# Patient Record
Sex: Female | Born: 1990 | Race: White | Hispanic: No | Marital: Single | Smoking: Never smoker
Health system: Southern US, Community
[De-identification: ages and names within clinical notes are randomized; demographics above are authoritative.]

## PROBLEM LIST (undated history)

## (undated) DIAGNOSIS — N809 Endometriosis, unspecified: Secondary | ICD-10-CM

## (undated) DIAGNOSIS — M25569 Pain in unspecified knee: Secondary | ICD-10-CM

## (undated) DIAGNOSIS — F99 Mental disorder, not otherwise specified: Secondary | ICD-10-CM

## (undated) DIAGNOSIS — F419 Anxiety disorder, unspecified: Secondary | ICD-10-CM

## (undated) HISTORY — PX: KNEE SURGERY: SHX244

## (undated) HISTORY — PX: KNEE ARTHROSCOPY: SUR90

## (undated) HISTORY — DX: Anxiety disorder, unspecified: F41.9

## (undated) HISTORY — DX: Pain in unspecified knee: M25.569

---

## 2011-06-14 ENCOUNTER — Ambulatory Visit (INDEPENDENT_AMBULATORY_CARE_PROVIDER_SITE_OTHER): Payer: No Typology Code available for payment source | Admitting: Physician Assistant

## 2011-06-14 DIAGNOSIS — F411 Generalized anxiety disorder: Secondary | ICD-10-CM

## 2011-06-14 DIAGNOSIS — F410 Panic disorder [episodic paroxysmal anxiety] without agoraphobia: Secondary | ICD-10-CM

## 2011-06-14 DIAGNOSIS — Z3009 Encounter for other general counseling and advice on contraception: Secondary | ICD-10-CM

## 2011-06-14 MED ORDER — SERTRALINE HCL 100 MG PO TABS: 100.0000 mg | ORAL_TABLET | Freq: Every day | ORAL | Status: DC

## 2011-06-14 MED ORDER — ALPRAZOLAM 0.25 MG PO TABS: 0.2500 mg | ORAL_TABLET | Freq: Three times a day (TID) | ORAL | Status: AC | PRN

## 2011-06-14 MED ORDER — NORETHINDRONE ACET-ETHINYL EST 1.5-30 MG-MCG PO TABS: 1.0000 | ORAL_TABLET | Freq: Every day | ORAL | Status: DC

## 2011-06-14 NOTE — Patient Instructions (Signed)
Continue condom use. Use birth control pills until you have the IUD placed.  If you haven't heard about your appointment with GYN in 2 weeks, please call.  When restarting the sertraline (Zoloft), take 1/2 tablet daily for 2 weeks, then increase to the full tablet.

## 2011-06-14 NOTE — Progress Notes (Signed)
  Subjective:    Patient ID: Sarah Chen, female    DOB: 29-Oct-1990, 21 y.o.   MRN: [id number]  HPI This patient presents to establish for primary care. She needs a refill of sertraline for generalized anxiety disorder and would like to discuss contraception options.  She is [place] and grew up in [place] where her father was stationed with the [employer]. He has retired there. She is in [place] for school, attending [employer]. Her parents divorced when she was young. She lived primarily with her father. Her mother lives in [place].  She was previously on Depo-Provera for contraception. Did not like the mood lability that she experienced near the end of each course. She then tried combined pills, had a difficult time remembering to take them regularly. She is interested in an IUD.  Diagnosed with generalized anxiety disorder in high school. Was having frequent panic attacks in class. Was started on sertraline just before leaving [place] to come to the [place]. Did not notice an improvement in her symptoms on sertraline 50 mg daily, has since run out. Her symptoms have progressed, panic attacks happening frequently. She describes a sensation of pain in her chest and feelings of severe fear and anxiety. Episodes last approximately 10 minutes and when they are resolved she feels exhausted.   Review of Systems  Constitutional: Negative.   HENT: Negative.   Eyes: Negative.   Respiratory: Positive for chest tightness. Negative for cough, choking, shortness of breath, wheezing and stridor.   Cardiovascular: Positive for chest pain and palpitations. Negative for leg swelling.  Gastrointestinal: Negative for nausea, vomiting and diarrhea.  Genitourinary: Negative for dysuria, urgency and frequency.  Musculoskeletal: Negative for myalgias, back pain and arthralgias.  Neurological: Negative for dizziness, syncope, weakness, light-headedness and headaches.  Hematological: Negative for adenopathy.    Psychiatric/Behavioral: Negative for suicidal ideas, confusion, self-injury, dysphoric mood and decreased concentration. The patient is nervous/anxious.        Objective:   Physical Exam  Vitals reviewed. Constitutional: She is oriented to person, place, and time. She appears well-developed and well-nourished. No distress.  HENT:  Head: Normocephalic and atraumatic.  Eyes: Conjunctivae are normal.  Neck: Normal range of motion. Neck supple. No thyromegaly present.  Cardiovascular: Regular rhythm, normal heart sounds and normal pulses.   No extrasystoles are present. Tachycardia present.   Pulmonary/Chest: Effort normal and breath sounds normal.  Lymphadenopathy:    She has no cervical adenopathy.  Neurological: She is alert and oriented to person, place, and time. No cranial nerve deficit.  Skin: Skin is warm and dry. No rash noted.  Psychiatric: Her speech is normal and behavior is normal. Judgment and thought content normal. Her mood appears anxious. Her affect is not angry, not blunt, not labile and not inappropriate. She does not exhibit a depressed mood.      Assessment & Plan:  1. Establish for primary care.  2. Contraception. Loestrin prescribed for short-term. Refer to GYN for IUD placement. Continue condom use.  3. Generalized anxiety disorder. Restart sertraline. 100 mg, one half tablet daily for 2 weeks then one tablet daily. Alprazolam when necessary panic. Reassess in 6 weeks.

## 2011-07-30 ENCOUNTER — Ambulatory Visit: Payer: No Typology Code available for payment source | Admitting: Physician Assistant

## 2011-07-31 ENCOUNTER — Encounter (HOSPITAL_COMMUNITY): Payer: Self-pay | Admitting: *Deleted

## 2011-07-31 ENCOUNTER — Emergency Department (HOSPITAL_COMMUNITY)
Admission: EM | Admit: 2011-07-31 | Discharge: 2011-07-31 | Disposition: A | Discharge status: Home / Self Care | Payer: No Typology Code available for payment source | Attending: Emergency Medicine | Admitting: Emergency Medicine

## 2011-07-31 ENCOUNTER — Emergency Department (HOSPITAL_COMMUNITY): Payer: No Typology Code available for payment source

## 2011-07-31 DIAGNOSIS — M542 Cervicalgia: Secondary | ICD-10-CM | POA: Insufficient documentation

## 2011-07-31 DIAGNOSIS — S139XXA Sprain of joints and ligaments of unspecified parts of neck, initial encounter: Secondary | ICD-10-CM | POA: Insufficient documentation

## 2011-07-31 DIAGNOSIS — Y92410 Unspecified street and highway as the place of occurrence of the external cause: Secondary | ICD-10-CM | POA: Insufficient documentation

## 2011-07-31 DIAGNOSIS — S161XXA Strain of muscle, fascia and tendon at neck level, initial encounter: Secondary | ICD-10-CM

## 2011-07-31 MED ORDER — OXYCODONE-ACETAMINOPHEN 5-325 MG PO TABS
2.0000 | ORAL_TABLET | Freq: Once | ORAL | Status: AC
  Administered 2011-07-31: 2 via ORAL
  Filled 2011-07-31: qty 2

## 2011-07-31 MED ORDER — CYCLOBENZAPRINE HCL 10 MG PO TABS: 10.0000 mg | ORAL_TABLET | Freq: Two times a day (BID) | ORAL | Status: AC | PRN

## 2011-07-31 MED ORDER — OXYCODONE-ACETAMINOPHEN 5-325 MG PO TABS: 2.0000 | ORAL_TABLET | ORAL | Status: AC | PRN
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
## 2011-07-31 NOTE — Discharge Instructions (Signed)
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
## 2011-07-31 NOTE — ED Notes (Signed)
Patient is AOx4 and comfortable with her discharge instructions.  Significant other is driving her home.

## 2011-08-29 ENCOUNTER — Telehealth: Payer: Self-pay | Admitting: Internal Medicine

## 2011-08-29 MED ORDER — SERTRALINE HCL 100 MG PO TABS: 100.0000 mg | ORAL_TABLET | Freq: Every day | ORAL | Status: DC

## 2011-08-29 NOTE — Telephone Encounter (Signed)
Refilled sertraline 100 mg #90 no refills

## 2011-08-30 ENCOUNTER — Telehealth: Payer: Self-pay | Admitting: Physician Assistant

## 2011-08-30 ENCOUNTER — Other Ambulatory Visit: Payer: Self-pay | Admitting: Physician Assistant

## 2011-08-30 MED ORDER — NORETHINDRONE ACET-ETHINYL EST 1.5-30 MG-MCG PO TABS: 1.0000 | ORAL_TABLET | Freq: Every day | ORAL | Status: DC

## 2011-08-30 NOTE — Telephone Encounter (Signed)
Fax request for refill of Junel 1.5/30.  Authorized #3 packs, refill x 3

## 2011-10-10 ENCOUNTER — Emergency Department (HOSPITAL_COMMUNITY): Payer: No Typology Code available for payment source

## 2011-10-10 ENCOUNTER — Encounter (HOSPITAL_COMMUNITY): Payer: Self-pay | Admitting: Emergency Medicine

## 2011-10-10 ENCOUNTER — Emergency Department (HOSPITAL_COMMUNITY)
Admission: EM | Admit: 2011-10-10 | Discharge: 2011-10-10 | Disposition: A | Discharge status: Home / Self Care | Payer: No Typology Code available for payment source | Attending: Emergency Medicine | Admitting: Emergency Medicine

## 2011-10-10 DIAGNOSIS — S93409A Sprain of unspecified ligament of unspecified ankle, initial encounter: Secondary | ICD-10-CM

## 2011-10-10 DIAGNOSIS — S9000XA Contusion of unspecified ankle, initial encounter: Secondary | ICD-10-CM | POA: Insufficient documentation

## 2011-10-10 DIAGNOSIS — F411 Generalized anxiety disorder: Secondary | ICD-10-CM | POA: Insufficient documentation

## 2011-10-10 DIAGNOSIS — Y92410 Unspecified street and highway as the place of occurrence of the external cause: Secondary | ICD-10-CM | POA: Insufficient documentation

## 2011-10-10 MED ORDER — METHOCARBAMOL 500 MG PO TABS: 500.0000 mg | ORAL_TABLET | Freq: Two times a day (BID) | ORAL | Status: AC

## 2011-10-10 MED ORDER — TRAMADOL HCL 50 MG PO TABS: 50.0000 mg | ORAL_TABLET | Freq: Four times a day (QID) | ORAL | Status: AC | PRN
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
## 2011-10-10 NOTE — Discharge Instructions (Signed)
Ankle Sprain An ankle sprain is an injury to the strong, fibrous tissues (ligaments) that hold the bones of your ankle joint together.  CAUSES Ankle sprain usually is caused by a fall or by twisting your ankle. People who participate in sports are more prone to these types of injuries.  SYMPTOMS  Symptoms of ankle sprain include:  Pain in your ankle. The pain may be present at rest or only when you are trying to stand or walk.   Swelling.   Bruising. Bruising may develop immediately or within 1 to 2 days after your injury.   Difficulty standing or walking.  DIAGNOSIS  Your caregiver will ask you details about your injury and perform a physical exam of your ankle to determine if you have an ankle sprain. During the physical exam, your caregiver will press and squeeze specific areas of your foot and ankle. Your caregiver will try to move your ankle in certain ways. An X-ray exam may be done to be sure a bone was not broken or a ligament did not separate from one of the bones in your ankle (avulsion).  TREATMENT  Certain types of braces can help stabilize your ankle. Your caregiver can make a recommendation for this. Your caregiver may recommend the use of medication for pain. If your sprain is severe, your caregiver may refer you to a surgeon who helps to restore function to parts of your skeletal system (orthopedist) or a physical therapist. HOME CARE INSTRUCTIONS  Apply ice to your injury for 1 to 2 days or as directed by your caregiver. Applying ice helps to reduce inflammation and pain.  Put ice in a plastic bag.   Place a towel between your skin and the bag.   Leave the ice on for 15 to 20 minutes at a time, every 2 hours while you are awake.   Take over-the-counter or prescription medicines for pain, discomfort, or fever only as directed by your caregiver.   Keep your injured leg elevated, when possible, to lessen swelling.   If your caregiver recommends crutches, use them as  instructed. Gradually, put weight on the affected ankle. Continue to use crutches or a cane until you can walk without feeling pain in your ankle.   If you have a plaster splint, wear the splint as directed by your caregiver. Do not rest it on anything harder than a pillow the first 24 hours. Do not put weight on it. Do not get it wet. You may take it off to take a shower or bath.   You may have been given an elastic bandage to wear around your ankle to provide support. If the elastic bandage is too tight (you have numbness or tingling in your foot or your foot becomes cold and blue), adjust the bandage to make it comfortable.   If you have an air splint, you may blow more air into it or let air out to make it more comfortable. You may take your splint off at night and before taking a shower or bath.   Wiggle your toes in the splint several times per day if you are able.  SEEK MEDICAL CARE IF:   You have an increase in bruising, swelling, or pain.   Your toes feel cold.   Pain relief is not achieved with medication.  SEEK IMMEDIATE MEDICAL CARE IF: Your toes are numb or blue or you have severe pain. MAKE SURE YOU:   Understand these instructions.   Will watch your condition.     Will get help right away if you are not doing well or get worse.  Document Released: 04/22/2005 Document Revised: 04/11/2011 Document Reviewed: 11/25/2007 [facility] Patient Information 2012 [place], [place].  Motor Vehicle Collision  It is common to have multiple bruises and sore muscles after a motor vehicle collision (MVC). These tend to feel worse for the first 24 hours. You may have the most stiffness and soreness over the first several hours. You may also feel worse when you wake up the first morning after your collision. After this point, you will usually begin to improve with each day. The speed of improvement often depends on the severity of the collision, the number of injuries, and the location and nature  of these injuries. HOME CARE INSTRUCTIONS   Put ice on the injured area.   Put ice in a plastic bag.   Place a towel between your skin and the bag.   Leave the ice on for 15 to 20 minutes, 3 to 4 times a day.   Drink enough fluids to keep your urine clear or pale yellow. Do not drink alcohol.   Take a warm shower or bath once or twice a day. This will increase blood flow to sore muscles.   You may return to activities as directed by your caregiver. Be careful when lifting, as this may aggravate neck or back pain.   Only take over-the-counter or prescription medicines for pain, discomfort, or fever as directed by your caregiver. Do not use aspirin. This may increase bruising and bleeding.  SEEK IMMEDIATE MEDICAL CARE IF:  You have numbness, tingling, or weakness in the arms or legs.   You develop severe headaches not relieved with medicine.   You have severe neck pain, especially tenderness in the middle of the back of your neck.   You have changes in bowel or bladder control.   There is increasing pain in any area of the body.   You have shortness of breath, lightheadedness, dizziness, or fainting.   You have chest pain.   You feel sick to your stomach (nauseous), throw up (vomit), or sweat.   You have increasing abdominal discomfort.   There is blood in your urine, stool, or vomit.   You have pain in your shoulder (shoulder strap areas).   You feel your symptoms are getting worse.  MAKE SURE YOU:   Understand these instructions.   Will watch your condition.   Will get help right away if you are not doing well or get worse.  Document Released: 04/22/2005 Document Revised: 04/11/2011 Document Reviewed: 09/19/2010 [facility] Patient Information 2012 [place], [place].  Contusion A contusion is a deep bruise. Contusions are the result of an injury that caused bleeding under the skin. The contusion may turn blue, purple, or yellow. Minor injuries will give you a painless  contusion, but more severe contusions may stay painful and swollen for a few weeks.  CAUSES  A contusion is usually caused by a blow, trauma, or direct force to an area of the body. SYMPTOMS   Swelling and redness of the injured area.   Bruising of the injured area.   Tenderness and soreness of the injured area.   Pain.  DIAGNOSIS  The diagnosis can be made by taking a history and physical exam. An X-ray, CT scan, or MRI may be needed to determine if there were any associated injuries, such as fractures. TREATMENT  Specific treatment will depend on what area of the body was injured. In general, the  best treatment for a contusion is resting, icing, elevating, and applying cold compresses to the injured area. Over-the-counter medicines may also be recommended for pain control. Ask your caregiver what the best treatment is for your contusion. HOME CARE INSTRUCTIONS   Put ice on the injured area.   Put ice in a plastic bag.   Place a towel between your skin and the bag.   Leave the ice on for 15 to 20 minutes, 3 to 4 times a day.   Only take over-the-counter or prescription medicines for pain, discomfort, or fever as directed by your caregiver. Your caregiver may recommend avoiding anti-inflammatory medicines (aspirin, ibuprofen, and naproxen) for 48 hours because these medicines may increase bruising.   Rest the injured area.   If possible, elevate the injured area to reduce swelling.  SEEK IMMEDIATE MEDICAL CARE IF:   You have increased bruising or swelling.   You have pain that is getting worse.   Your swelling or pain is not relieved with medicines.  MAKE SURE YOU:   Understand these instructions.   Will watch your condition.   Will get help right away if you are not doing well or get worse.  Document Released: 01/30/2005 Document Revised: 04/11/2011 Document Reviewed: 02/25/2011 [facility] Patient Information 2012 [place], [place].

## 2011-10-10 NOTE — ED Notes (Signed)
Rt ankle in immobilizer via ems
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
## 2011-10-12 NOTE — ED Provider Notes (Signed)
Medical screening examination/treatment/procedure(s) were performed by non-physician practitioner and as supervising physician I was immediately available for consultation/collaboration.   Forbes Cellar, MD 10/12/11 [phone]

## 2011-10-22 ENCOUNTER — Ambulatory Visit: Payer: No Typology Code available for payment source | Admitting: Physician Assistant

## 2011-11-03 ENCOUNTER — Other Ambulatory Visit: Payer: Self-pay | Admitting: Physician Assistant

## 2011-11-05 ENCOUNTER — Ambulatory Visit: Payer: No Typology Code available for payment source

## 2011-11-05 ENCOUNTER — Ambulatory Visit (INDEPENDENT_AMBULATORY_CARE_PROVIDER_SITE_OTHER): Payer: No Typology Code available for payment source | Admitting: Emergency Medicine

## 2011-11-05 ENCOUNTER — Encounter: Payer: Self-pay | Admitting: Physician Assistant

## 2011-11-05 VITALS — BP 120/76 | HR 82 | Temp 97.8°F | Resp 16 | Ht 66.0 in | Wt 110.4 lb

## 2011-11-05 DIAGNOSIS — F419 Anxiety disorder, unspecified: Secondary | ICD-10-CM

## 2011-11-05 DIAGNOSIS — S93409A Sprain of unspecified ligament of unspecified ankle, initial encounter: Secondary | ICD-10-CM

## 2011-11-05 DIAGNOSIS — F411 Generalized anxiety disorder: Secondary | ICD-10-CM

## 2011-11-05 DIAGNOSIS — M25579 Pain in unspecified ankle and joints of unspecified foot: Secondary | ICD-10-CM

## 2011-11-05 MED ORDER — ALPRAZOLAM 0.25 MG PO TABS: 0.2500 mg | ORAL_TABLET | Freq: Three times a day (TID) | ORAL | Status: AC | PRN
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
## 2012-02-15 ENCOUNTER — Other Ambulatory Visit: Payer: Self-pay | Admitting: Physician Assistant

## 2012-05-15 ENCOUNTER — Other Ambulatory Visit: Payer: Self-pay | Admitting: Physician Assistant

## 2012-07-21 ENCOUNTER — Ambulatory Visit: Payer: 59

## 2012-07-21 ENCOUNTER — Ambulatory Visit (INDEPENDENT_AMBULATORY_CARE_PROVIDER_SITE_OTHER): Payer: 59 | Admitting: Emergency Medicine

## 2012-07-21 VITALS — BP 110/74 | HR 91 | Temp 98.6°F | Resp 16 | Ht 66.0 in | Wt 124.2 lb

## 2012-07-21 DIAGNOSIS — M25571 Pain in right ankle and joints of right foot: Secondary | ICD-10-CM

## 2012-07-21 MED ORDER — MELOXICAM 15 MG PO TABS: 15.0000 mg | ORAL_TABLET | Freq: Every day | ORAL | Status: DC
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
## 2012-07-21 NOTE — Patient Instructions (Addendum)
Begin taking Mobic (meloxicam) once daily for inflammation and pain.  Do not take any additional Advil or Aleve while taking this medication.  You can use Tylenol if needed for further pain relief.  Do the Mobic for at least the next 7 days, can do for another week past that if you are getting good benefit.  Use the ACE wrap for the next 7 days.  Continue icing and elevating.  I have put in a referral to physical therapy, so you should be getting a call about scheduling this.  Relative rest for the next week, than start gradually adding back activities.  Let me know if anything is worsening.

## 2013-01-02 ENCOUNTER — Ambulatory Visit (INDEPENDENT_AMBULATORY_CARE_PROVIDER_SITE_OTHER): Payer: 59 | Admitting: Physician Assistant

## 2013-01-02 VITALS — BP 108/70 | HR 110 | Temp 100.0°F | Resp 18 | Ht 67.0 in | Wt 124.0 lb

## 2013-01-02 DIAGNOSIS — R509 Fever, unspecified: Secondary | ICD-10-CM

## 2013-01-02 DIAGNOSIS — J029 Acute pharyngitis, unspecified: Secondary | ICD-10-CM

## 2013-01-02 LAB — POCT CBC
Granulocyte percent: 84.1 %G — AB (ref 37–80)
HCT, POC: 36.4 % — AB (ref 37.7–47.9)
Hemoglobin: 11.2 g/dL — AB (ref 12.2–16.2)
Lymph, poc: 1.2 (ref 0.6–3.4)
MCH, POC: 29.2 pg (ref 27–31.2)
MCHC: 30.8 g/dL — AB (ref 31.8–35.4)
MCV: 94.7 fL (ref 80–97)
MID (cbc): 0.7 (ref 0–0.9)
MPV: 9.6 fL (ref 0–99.8)
POC Granulocyte: 9.6 — AB (ref 2–6.9)
POC LYMPH PERCENT: 10.1 %L (ref 10–50)
POC MID %: 5.8 %M (ref 0–12)
Platelet Count, POC: 171 10*3/uL (ref 142–424)
RBC: 3.84 M/uL — AB (ref 4.04–5.48)
RDW, POC: 13.1 %
WBC: 11.4 10*3/uL — AB (ref 4.6–10.2)

## 2013-01-02 LAB — POCT RAPID STREP A (OFFICE): Rapid Strep A Screen: NEGATIVE

## 2013-01-02 MED ORDER — AMOXICILLIN 875 MG PO TABS: 875.0000 mg | ORAL_TABLET | Freq: Two times a day (BID) | ORAL | Status: DC

## 2013-01-02 NOTE — Progress Notes (Addendum)
Subjective:    Patient ID: Sarah Chen, female    DOB: 08-Jul-1990, 22 y.o.   MRN: [id number]  HPI 22 year old female presents with acute onset of sore throat, fever, chills, and body aches.  States symptoms started suddenly today and have progressively worsened.  She admits she felt fine yesterday but today feels fatigued and achy.  Complains of sore throat that is causing pain with swallowing. She is able to drink water but does have pain associated with this.  No significant hx of strep and no known contacts.  She works as a Ecologist.  No medications tried yet. Admits to mild right ear pain. Denies headache, nausea, vomiting, sinus pain, cough, or abdominal pain. Hx of mono infection in the past.  Patient is otherwise doing well with no other concerns today.     Review of Systems  Constitutional: Positive for fever, chills and fatigue.  HENT: Positive for ear pain (right) and sore throat. Negative for congestion, rhinorrhea, trouble swallowing, neck pain, neck stiffness and postnasal drip.   Respiratory: Negative for cough and shortness of breath.   Gastrointestinal: Negative for nausea, vomiting and abdominal pain.  Neurological: Negative for dizziness and headaches.       Objective:   Physical Exam  Constitutional: She is oriented to person, place, and time. She appears well-developed and well-nourished.  HENT:  Head: Normocephalic and atraumatic.  Right Ear: Hearing, tympanic membrane, external ear and ear canal normal.  Left Ear: Hearing, tympanic membrane, external ear and ear canal normal.  Mouth/Throat: Uvula is midline and mucous membranes are normal. Posterior oropharyngeal erythema present. No oropharyngeal exudate, posterior oropharyngeal edema or tonsillar abscesses.  Eyes: Conjunctivae are normal.  Neck: Normal range of motion. Neck supple.  Cardiovascular: Normal rate, regular rhythm and normal heart sounds.   Pulmonary/Chest: Effort normal and breath sounds  normal.  Lymphadenopathy:    She has cervical adenopathy (+AC).  Neurological: She is alert and oriented to person, place, and time.  Psychiatric: She has a normal mood and affect. Her behavior is normal. Judgment and thought content normal.   Results for orders placed in visit on 01/02/13  POCT RAPID STREP A (OFFICE)      Result Value Range   Rapid Strep A Screen Negative  Negative  POCT CBC      Result Value Range   WBC 11.4 (*) 4.6 - 10.2 K/uL   Lymph, poc 1.2  0.6 - 3.4   POC LYMPH PERCENT 10.1  10 - 50 %L   MID (cbc) 0.7  0 - 0.9   POC MID % 5.8  0 - 12 %M   POC Granulocyte 9.6 (*) 2 - 6.9   Granulocyte percent 84.1 (*) 37 - 80 %G   RBC 3.84 (*) 4.04 - 5.48 M/uL   Hemoglobin 11.2 (*) 12.2 - 16.2 g/dL   HCT, POC 82.9 (*) 56.2 - 47.9 %   MCV 94.7  80 - 97 fL   MCH, POC 29.2  27 - 31.2 pg   MCHC 30.8 (*) 31.8 - 35.4 g/dL   RDW, POC 13.0     Platelet Count, POC 171  142 - 424 K/uL   MPV 9.6  0 - 99.8 fL      Ibuprofen 600 mg given in office today.     Assessment & Plan:  Acute pharyngitis - Plan: POCT rapid strep A  Fever, unspecified  Throat culture pending.  Despite negative strep, will go ahead and start antibiotics based  on CBC Amoxicillin 875 mg bid x 10 days Increase fluids and rest Continue ibuprofen 600 mg tid prn fever and pain.  Follow up if symptoms worsen or fail to improve.

## 2013-01-05 ENCOUNTER — Telehealth: Payer: Self-pay

## 2013-01-05 LAB — CULTURE, GROUP A STREP: Organism ID, Bacteria: NORMAL

## 2013-01-05 NOTE — Telephone Encounter (Signed)
PT STATES WE HAD CALLED HER REGARDING LABS, PLEASE CALL BACK AT [phone] AND LEAVE MESSAGE

## 2013-01-06 NOTE — Telephone Encounter (Signed)
Spoke with pt advised lab results. Pt understood. 

## 2013-04-30 ENCOUNTER — Encounter (HOSPITAL_COMMUNITY): Payer: Self-pay | Admitting: Emergency Medicine

## 2013-04-30 ENCOUNTER — Emergency Department (HOSPITAL_COMMUNITY)
Admission: EM | Admit: 2013-04-30 | Discharge: 2013-05-01 | Discharge status: Against Medical Advice | Payer: 59 | Attending: Emergency Medicine | Admitting: Emergency Medicine

## 2013-04-30 DIAGNOSIS — N898 Other specified noninflammatory disorders of vagina: Secondary | ICD-10-CM | POA: Insufficient documentation

## 2013-04-30 DIAGNOSIS — R112 Nausea with vomiting, unspecified: Secondary | ICD-10-CM | POA: Insufficient documentation

## 2013-04-30 DIAGNOSIS — R42 Dizziness and giddiness: Secondary | ICD-10-CM | POA: Insufficient documentation

## 2013-04-30 MED ORDER — ONDANSETRON 8 MG PO TBDP
8.0000 mg | ORAL_TABLET | Freq: Once | ORAL | Status: AC
  Administered 2013-04-30: 8 mg via ORAL
  Filled 2013-04-30: qty 1

## 2013-04-30 NOTE — ED Notes (Signed)
Called for blood draw, not in lobby

## 2013-04-30 NOTE — ED Notes (Signed)
Pt reports that she began having vaginal bleeding this morning with vomiting and felt light headed. Pt reports she is still nauseated and bleeding with lightheadedness. Pt a&o, ambulatory to triage, NAD noted at this time.

## 2013-05-01 ENCOUNTER — Ambulatory Visit (INDEPENDENT_AMBULATORY_CARE_PROVIDER_SITE_OTHER): Payer: 59 | Admitting: Family Medicine

## 2013-05-01 VITALS — BP 102/68 | HR 85 | Temp 98.5°F | Resp 16 | Ht 65.75 in | Wt 127.6 lb

## 2013-05-01 DIAGNOSIS — N9489 Other specified conditions associated with female genital organs and menstrual cycle: Secondary | ICD-10-CM

## 2013-05-01 DIAGNOSIS — Z79899 Other long term (current) drug therapy: Secondary | ICD-10-CM

## 2013-05-01 DIAGNOSIS — R112 Nausea with vomiting, unspecified: Secondary | ICD-10-CM

## 2013-05-01 DIAGNOSIS — N920 Excessive and frequent menstruation with regular cycle: Secondary | ICD-10-CM

## 2013-05-01 DIAGNOSIS — N949 Unspecified condition associated with female genital organs and menstrual cycle: Secondary | ICD-10-CM

## 2013-05-01 LAB — COMPREHENSIVE METABOLIC PANEL
ALT: 8 U/L (ref 0–35)
AST: 15 U/L (ref 0–37)
Albumin: 4.2 g/dL (ref 3.5–5.2)
CO2: 26 mEq/L (ref 19–32)
Chloride: 101 mEq/L (ref 96–112)
GFR calc non Af Amer: >90 mL/min (ref >90)
Potassium: 3.6 mEq/L (ref 3.5–5.1)
Sodium: 137 mEq/L (ref 135–145)
Total Bilirubin: 0.1 mg/dL — ABNORMAL LOW (ref 0.3–1.2)

## 2013-05-01 LAB — POCT UA - MICROSCOPIC ONLY
Casts, Ur, LPF, POC: NEGATIVE
Mucus, UA: NEGATIVE
Yeast, UA: NEGATIVE

## 2013-05-01 LAB — CBC
MCV: 89.4 fL (ref 78.0–100.0)
Platelets: 239 10*3/uL (ref 150–400)
RBC: 4.23 MIL/uL (ref 3.87–5.11)
WBC: 9 10*3/uL (ref 4.0–10.5)

## 2013-05-01 LAB — POCT CBC
Hemoglobin: 12.1 g/dL — AB (ref 12.2–16.2)
MCHC: 30.8 g/dL — AB (ref 31.8–35.4)
MID (cbc): 0.4 (ref 0–0.9)
MPV: 10 fL (ref 0–99.8)
POC Granulocyte: 4.3 (ref 2–6.9)
POC MID %: 5.9 %M (ref 0–12)
Platelet Count, POC: 203 10*3/uL (ref 142–424)
RBC: 4.12 M/uL (ref 4.04–5.48)

## 2013-05-01 LAB — POCT URINALYSIS DIPSTICK
Bilirubin, UA: NEGATIVE
Blood, UA: NEGATIVE
Glucose, UA: NEGATIVE
Nitrite, UA: NEGATIVE
Urobilinogen, UA: 0.2

## 2013-05-01 LAB — POCT URINE PREGNANCY: Preg Test, Ur: NEGATIVE

## 2013-05-01 LAB — POCT WET PREP WITH KOH: Trichomonas, UA: NEGATIVE

## 2013-05-01 MED ORDER — ONDANSETRON 8 MG PO TBDP: 8.0000 mg | ORAL_TABLET | Freq: Three times a day (TID) | ORAL | Status: DC | PRN

## 2013-05-01 MED ORDER — DICLOFENAC SODIUM 75 MG PO TBEC: 75.0000 mg | DELAYED_RELEASE_TABLET | Freq: Three times a day (TID) | ORAL | Status: DC | PRN

## 2013-05-01 MED ORDER — MEDROXYPROGESTERONE ACETATE 150 MG/ML IM SUSP
150.0000 mg | Freq: Once | INTRAMUSCULAR | Status: AC
  Administered 2013-05-01: 150 mg via INTRAMUSCULAR

## 2013-05-01 MED ORDER — CEFTRIAXONE SODIUM 1 G IJ SOLR
250.0000 mg | Freq: Once | INTRAMUSCULAR | Status: AC
  Administered 2013-05-01: 250 mg via INTRAMUSCULAR

## 2013-05-01 MED ORDER — METRONIDAZOLE 500 MG PO TABS: 500.0000 mg | ORAL_TABLET | Freq: Two times a day (BID) | ORAL | Status: DC

## 2013-05-01 NOTE — ED Notes (Signed)
Pt left ED at this time.

## 2013-05-01 NOTE — Progress Notes (Addendum)
This chart was scribed for [name], MD by Luisa Dago, ED Scribe. This patient was seen in room 9 and the patient's care was started at 12:15 PM.  Subjective:    Patient ID: Sarah Chen, female    DOB: 09/29/1990, 22 y.o.   MRN: [id number] Chief Complaint  Patient presents with   Menorrhagia   Emesis   Dizziness    HPI HPI Comments: Sarah Chen is a 22 y.o. female who presents to [facility] complaining of Menorrhagia that started 2 days ago. She describes the blood as maroon in color. She is also complaining of associated emesis and dizziness that started 1 day ago. Pt last meal was at 6 PM yesterday with her last episode of emesis at 11 PM. Pt reports that she went to the Emergency Department yesterday where she was given Zofran for her nausea. Pt states that she receives Depo shots every 3 months, however, she was unable to get her last shot December 20 due to work. While on Depo, she describes her periods as being regular. She states that over time they show down and taper off. Pt reports that her last sexual intercourse was more than 3 months ago. She denies any history of STDs. Her LNMP was 2 weeks ago and lasted for 7 days. She denies dysuria and frequency.   Past Medical History  Diagnosis Date   Anxiety    Knee pain    Anxiety    Anxiety    Current Outpatient Prescriptions on File Prior to Visit  Medication Sig Dispense Refill   amoxicillin (AMOXIL) 875 MG tablet Take 1 tablet (875 mg total) by mouth 2 (two) times daily.  20 tablet  0   No current facility-administered medications on file prior to visit.   No Known Allergies  Review of Systems  Constitutional: Positive for diaphoresis. Negative for fever, chills and appetite change.  Gastrointestinal: Positive for nausea, vomiting and abdominal pain. Negative for diarrhea.  Genitourinary: Positive for vaginal bleeding. Negative for dysuria and urgency.  Neurological: Positive for  dizziness. Negative for headaches.      Vital Signs: BP 102/68   Pulse 85   Temp(Src) 98.5 F (36.9 C) (Oral)   Resp 16   Ht 5' 5.75" (1.67 m)   Wt 127 lb 9.6 oz (57.879 kg)   BMI 20.75 kg/m2   SpO2 99%   LMP 04/15/2013 Objective:   Physical Exam  Nursing note and vitals reviewed. Constitutional: She is oriented to person, place, and time. She appears well-developed and well-nourished.  HENT:  Head: Normocephalic and atraumatic.  Cardiovascular: Normal rate, regular rhythm and normal heart sounds.   No murmur heard. Pulmonary/Chest: Effort normal and breath sounds normal. No respiratory distress. She has no wheezes. She has no rales.  Abdominal: Soft. Bowel sounds are normal. She exhibits no distension. There is tenderness.  Mild CVA tenderness.   Genitourinary: Uterus is tender. Uterus is not enlarged. Cervix exhibits no motion tenderness and no friability. There is bleeding around the vagina.  Lymphadenopathy:       Head (right side): No tonsillar adenopathy present.    She has no cervical adenopathy.    She has no axillary adenopathy.  Thyroid normal  Neurological: She is alert and oriented to person, place, and time.  Skin: Skin is warm and dry.  Psychiatric: She has a normal mood and affect.    Results for orders placed in visit on 05/01/13  POCT CBC  Result Value Range   WBC 6.6  4.6 - 10.2 K/uL   Lymph, poc 1.9  0.6 - 3.4   POC LYMPH PERCENT 28.4  10 - 50 %L   MID (cbc) 0.4  0 - 0.9   POC MID % 5.9  0 - 12 %M   POC Granulocyte 4.3  2 - 6.9   Granulocyte percent 65.7  37 - 80 %G   RBC 4.12  4.04 - 5.48 M/uL   Hemoglobin 12.1 (*) 12.2 - 16.2 g/dL   HCT, POC 40.9  81.1 - 47.9 %   MCV 95.5  80 - 97 fL   MCH, POC 29.4  27 - 31.2 pg   MCHC 30.8 (*) 31.8 - 35.4 g/dL   RDW, POC 91.4     Platelet Count, POC 203  142 - 424 K/uL   MPV 10.0  0 - 99.8 fL  POCT UA - MICROSCOPIC ONLY      Result Value Range   WBC, Ur, HPF, POC 0-1     RBC, urine, microscopic 0-1      Bacteria, U Microscopic trace     Mucus, UA neg     Epithelial cells, urine per micros 0-2     Crystals, Ur, HPF, POC neg     Casts, Ur, LPF, POC neg     Yeast, UA neg    POCT URINALYSIS DIPSTICK      Result Value Range   Color, UA yellow     Clarity, UA cloudy     Glucose, UA neg     Bilirubin, UA neg     Ketones, UA neg     Spec Grav, UA 1.015     Blood, UA neg     pH, UA 7.5     Protein, UA neg     Urobilinogen, UA 0.2     Nitrite, UA neg     Leukocytes, UA Negative    POCT WET PREP WITH KOH      Result Value Range   Trichomonas, UA Negative     Clue Cells Wet Prep HPF POC 0-1     Epithelial Wet Prep HPF POC 2-5     Yeast Wet Prep HPF POC neg     Bacteria Wet Prep HPF POC 2+     RBC Wet Prep HPF POC 15-20     WBC Wet Prep HPF POC 2-4     KOH Prep POC Negative    POCT URINE PREGNANCY      Result Value Range   Preg Test, Ur Negative        Assessment & Plan:   Menorrhagia - Plan: POCT CBC, POCT UA - Microscopic Only, POCT urinalysis dipstick, POCT Wet Prep with KOH, POCT urine pregnancy, medroxyPROGESTERone (DEPO-PROVERA) injection 150 mg, CANCELED: GC/Chlamydia Amp Probe, Genital  Nausea with vomiting - Plan: POCT CBC, POCT UA - Microscopic Only, POCT urinalysis dipstick, POCT Wet Prep with KOH, POCT urine pregnancy, CANCELED: GC/Chlamydia Amp Probe, Genital  Uterine pain - Plan: cefTRIAXone (ROCEPHIN) injection 250 mg  Meds ordered this encounter  Medications   cefTRIAXone (ROCEPHIN) injection 250 mg    Sig:    ondansetron (ZOFRAN-ODT) 8 MG disintegrating tablet    Sig: Take 1 tablet (8 mg total) by mouth every 8 (eight) hours as needed for nausea.    Dispense:  30 tablet    Refill:  0   metroNIDAZOLE (FLAGYL) 500 MG tablet    Sig: Take 1 tablet (500 mg total)  by mouth 2 (two) times daily.    Dispense:  14 tablet    Refill:  0   medroxyPROGESTERone (DEPO-PROVERA) injection 150 mg    Sig:    diclofenac (VOLTAREN) 75 MG EC tablet    Sig: Take 1 tablet  (75 mg total) by mouth 3 (three) times daily as needed for mild pain.    Dispense:  30 tablet    Refill:  0    I personally performed the services described in this documentation, which was scribed in my presence. The recorded information has been reviewed and considered, and addended by me as needed.  Norberto Sorenson, MD MPH

## 2013-05-01 NOTE — Patient Instructions (Signed)
Metrorrhagia   Metrorrhagia is uterine bleeding at irregular intervals, especially between menstrual periods.   CAUSES    Dysfunctional uterine bleeding.   Uterine lining growing outside the uterus (endometriosis).   Embryo adhering to uterine wall (implantation).   Pregnancy growing in the fallopian tubes (ectopic pregnancy).   Miscarriage.   Menopause.   Cancer of the reproduction organs.   Certain drugs such as hormonal contraceptives.   Inherited bleeding disorders.   Trauma.   Uterine fibroids.   Sexually transmitted diseases (STDs).   Polycystic ovarian disease.  DIAGNOSIS   A history will be taken.   A physical exam will be performed.   Other tests may include:   Blood tests.   A pregnancy test.   An ultrasound of the abdomen and pelvis.   A biopsy of the uterine lining.   AMRI or CT scan of the abdomen and pelvis.  TREATMENT  Treatment will depend on the cause.  HOME CARE INSTRUCTIONS    Take all medicines as directed by your caregiver. Do not change or switch medicines without talking to your caregiver.   Take all iron supplements exactly as directed by your caregiver. Iron supplements help to replace the iron your body loses from irregular bleeding.If you become constipated, increase the amount of fiber, fruits, and vegetables in your diet.   Do not take aspirin or medicines that contain aspirin for 1 week before your menstrual period or during your menstrual period. Aspirin may increase the bleeding.   Rest as much as possible if you change your sanitary pad or tampon more than once every 2 hours.   Eat well-balanced meals including foods high in iron, such as green leafy vegetables, red meat, liver, eggs, and whole-grain breads and cereals.   Do not try to lose weight until the abnormal bleeding is controlled and your blood iron level is back to normal.  SEEK MEDICAL CARE IF:    You have nausea and vomiting, or you cannot keep foods down.   You feel dizzy or have diarrhea  while taking medicine.   You have any problems that may be related to the medicine you are taking.  SEEK IMMEDIATE MEDICAL CARE IF:    You have a fever.   You develop chills.   You become lightheaded or faint.   You need to change your sanitary pad or tampon more than once an hour.   Your bleeding becomesheavy.   You begin to pass clots or tissue.  MAKE SURE YOU:    Understand these instructions.   Will watch your condition.   Will get help right away if you are not doing well or get worse.  Document Released: 04/22/2005 Document Revised: 07/15/2011 Document Reviewed: 11/19/2010  [employer] Patient Information 2014 [employer]

## 2013-05-03 LAB — GC/CHLAMYDIA PROBE AMP: GC Probe RNA: NEGATIVE

## 2013-05-05 ENCOUNTER — Encounter: Payer: Self-pay | Admitting: Family Medicine

## 2013-05-05 NOTE — Progress Notes (Signed)
This chart was scribed for [name], MD by Luisa Dago, ED Scribe. This patient was seen in room 9 and the patient's care was started at 12:15 PM.  Subjective:    Patient ID: Sarah Chen, female    DOB: 06/29/1990, 22 y.o.   MRN: [id number] Chief Complaint  Patient presents with  . Menorrhagia  . Emesis  . Dizziness    Emesis  Associated symptoms include abdominal pain and dizziness. Pertinent negatives include no chills, diarrhea, fever or headaches.  Dizziness Associated symptoms include abdominal pain, diaphoresis, fatigue, nausea and vomiting. Pertinent negatives include no chills, fever or headaches.   HPI Comments: Sarah Chen is a 22 y.o. female who presents to [facility] complaining of menorrhagia that started 2 days ago. She is having very heavy loss vaginally of nml menstrual maroon blood. She is also complaining of associated emesis and dizziness that started yesterday. Pt last meal was at 6 PM yesterday with her last episode of emesis at 11 PM. Pt reports that she went to the Emergency Department last night for eval of this where she was given Zofran for her nausea. No vomiting since then, the zofran made her really tired. She had to leave the ED AMA last night before her evaluation could be completed because her ride had to go and she would have been stranded so was not discharged w/ any diagnosis or treatment for her sxs.  Pt states that she receives Depo shots every 3 months, however, she was unable to get her last shot which was due on December 20 due to her work sched. While on Depo, she describes her periods as being regular though have seemed to taper off the longer she was on Depo. Pt reports that her last sexual intercourse was more than 3 months ago so abs no chance of preg.   She denies any history of STDs. Her LNMP was 2 weeks ago and lasted for 7 days. She denies dysuria and frequency.   Past Medical History  Diagnosis Date  . Anxiety     . Knee pain   . Anxiety   . Anxiety    No current outpatient prescriptions on file prior to visit.   No current facility-administered medications on file prior to visit.   No Known Allergies  Review of Systems  Constitutional: Positive for diaphoresis, appetite change and fatigue. Negative for fever, chills, activity change and unexpected weight change.  Gastrointestinal: Positive for nausea, vomiting and abdominal pain. Negative for diarrhea, constipation and abdominal distention.  Genitourinary: Positive for vaginal bleeding and menstrual problem. Negative for dysuria, urgency, frequency, vaginal discharge, difficulty urinating and genital sores.  Neurological: Positive for dizziness and light-headedness. Negative for headaches.  Psychiatric/Behavioral: Negative for sleep disturbance.      Vital Signs: BP 102/68  Pulse 85  Temp(Src) 98.5 F (36.9 C) (Oral)  Resp 16  Ht 5' 5.75" (1.67 m)  Wt 127 lb 9.6 oz (57.879 kg)  BMI 20.75 kg/m2  SpO2 99%  LMP 04/15/2013 Objective:   Physical Exam  Nursing note and vitals reviewed. Constitutional: She is oriented to person, place, and time. She appears well-developed and well-nourished. No distress.  HENT:  Head: Normocephalic and atraumatic.  Right Ear: External ear normal.  Left Ear: External ear normal.  Eyes: Conjunctivae are normal. No scleral icterus.  Neck: Normal range of motion. Neck supple. No mass and no thyromegaly present.  Cardiovascular: Normal rate, regular rhythm, normal heart sounds and intact distal pulses.  No murmur heard. Pulmonary/Chest: Effort normal and breath sounds normal. No respiratory distress. She has no wheezes. She has no rales.  Abdominal: Soft. Bowel sounds are normal. She exhibits no distension. There is no hepatosplenomegaly. There is generalized tenderness. There is CVA tenderness (mild). No hernia.  Genitourinary: Uterus is tender. Uterus is not enlarged. Cervix exhibits no motion tenderness  and no friability. Right adnexum displays tenderness. Right adnexum displays no mass. Left adnexum displays tenderness. Left adnexum displays no mass. There is bleeding around the vagina.  Musculoskeletal: She exhibits no edema.  Lymphadenopathy:       Head (right side): No submandibular, no tonsillar, no preauricular and no posterior auricular adenopathy present.       Head (left side): No submandibular, no tonsillar, no preauricular and no posterior auricular adenopathy present.    She has no cervical adenopathy.    She has no axillary adenopathy.  Neurological: She is alert and oriented to person, place, and time.  Skin: Skin is warm and dry. She is not diaphoretic. No erythema.  Psychiatric: She has a normal mood and affect. Her behavior is normal.    Results for orders placed in visit on 05/01/13  GC/CHLAMYDIA PROBE AMP      Result Value Range   CT Probe RNA NEGATIVE     GC Probe RNA NEGATIVE    POCT CBC      Result Value Range   WBC 6.6  4.6 - 10.2 K/uL   Lymph, poc 1.9  0.6 - 3.4   POC LYMPH PERCENT 28.4  10 - 50 %L   MID (cbc) 0.4  0 - 0.9   POC MID % 5.9  0 - 12 %M   POC Granulocyte 4.3  2 - 6.9   Granulocyte percent 65.7  37 - 80 %G   RBC 4.12  4.04 - 5.48 M/uL   Hemoglobin 12.1 (*) 12.2 - 16.2 g/dL   HCT, POC 16.1  09.6 - 47.9 %   MCV 95.5  80 - 97 fL   MCH, POC 29.4  27 - 31.2 pg   MCHC 30.8 (*) 31.8 - 35.4 g/dL   RDW, POC 04.5     Platelet Count, POC 203  142 - 424 K/uL   MPV 10.0  0 - 99.8 fL  POCT UA - MICROSCOPIC ONLY      Result Value Range   WBC, Ur, HPF, POC 0-1     RBC, urine, microscopic 0-1     Bacteria, U Microscopic trace     Mucus, UA neg     Epithelial cells, urine per micros 0-2     Crystals, Ur, HPF, POC neg     Casts, Ur, LPF, POC neg     Yeast, UA neg    POCT URINALYSIS DIPSTICK      Result Value Range   Color, UA yellow     Clarity, UA cloudy     Glucose, UA neg     Bilirubin, UA neg     Ketones, UA neg     Spec Grav, UA 1.015      Blood, UA neg     pH, UA 7.5     Protein, UA neg     Urobilinogen, UA 0.2     Nitrite, UA neg     Leukocytes, UA Negative    POCT WET PREP WITH KOH      Result Value Range   Trichomonas, UA Negative     Clue Cells Wet Prep  HPF POC 0-1     Epithelial Wet Prep HPF POC 2-5     Yeast Wet Prep HPF POC neg     Bacteria Wet Prep HPF POC 2+     RBC Wet Prep HPF POC 15-20     WBC Wet Prep HPF POC 2-4     KOH Prep POC Negative    POCT URINE PREGNANCY      Result Value Range   Preg Test, Ur Negative        Assessment & Plan:  Menorrhagia - Plan: POCT CBC, POCT UA - Microscopic Only, POCT urinalysis dipstick, POCT Wet Prep with KOH, POCT urine pregnancy, medroxyPROGESTERone (DEPO-PROVERA) injection 150 mg, CANCELED: GC/Chlamydia Amp Probe, Genital - restart Depo-Provera which should help bleeding stop - if it cont, RTC for further eval.  Luckily hgb still wnml.  Nausea with vomiting - Plan: POCT CBC, POCT UA - Microscopic Only, POCT urinalysis dipstick, POCT Wet Prep with KOH, POCT urine pregnancy, - suspect benign viral GI illness - pt is tolerating liquids today w/o emesis and exam is benign. Push fluids, symptomatic care, RTC if worsening.  Uterine pain - Plan: cefTRIAXone (ROCEPHIN) injection 250 mg - start nsaid for cramping pain and treat empirically w/ rocephin and flagyl while labs pending.   Meds ordered this encounter  Medications  . cefTRIAXone (ROCEPHIN) injection 250 mg    Sig:   . ondansetron (ZOFRAN-ODT) 8 MG disintegrating tablet    Sig: Take 1 tablet (8 mg total) by mouth every 8 (eight) hours as needed for nausea.    Dispense:  30 tablet    Refill:  0  . metroNIDAZOLE (FLAGYL) 500 MG tablet    Sig: Take 1 tablet (500 mg total) by mouth 2 (two) times daily.    Dispense:  14 tablet    Refill:  0  . medroxyPROGESTERone (DEPO-PROVERA) injection 150 mg    Sig:   . diclofenac (VOLTAREN) 75 MG EC tablet    Sig: Take 1 tablet (75 mg total) by mouth 3 (three) times daily as  needed for mild pain.    Dispense:  30 tablet    Refill:  0    I personally performed the services described in this documentation, which was scribed in my presence. The recorded information has been reviewed and considered, and addended by me as needed.  Norberto Sorenson, MD MPH

## 2013-05-07 ENCOUNTER — Telehealth: Payer: Self-pay

## 2013-05-07 NOTE — Telephone Encounter (Signed)
Called to advise.  

## 2013-05-07 NOTE — Telephone Encounter (Signed)
Pt is still c/o uterine pain, pt would like to know if she should come in or not. Best# [phone]  [facility] [address]

## 2013-05-07 NOTE — Telephone Encounter (Signed)
Yes, needs to come in for eval.

## 2013-08-06 ENCOUNTER — Emergency Department (INDEPENDENT_AMBULATORY_CARE_PROVIDER_SITE_OTHER)
Admission: EM | Admit: 2013-08-06 | Discharge: 2013-08-06 | Disposition: A | Discharge status: Home / Self Care | Payer: 59 | Source: Home / Self Care | Attending: Emergency Medicine | Admitting: Emergency Medicine

## 2013-08-06 ENCOUNTER — Encounter (HOSPITAL_COMMUNITY): Payer: Self-pay | Admitting: Emergency Medicine

## 2013-08-06 DIAGNOSIS — L03119 Cellulitis of unspecified part of limb: Secondary | ICD-10-CM

## 2013-08-06 DIAGNOSIS — L03115 Cellulitis of right lower limb: Secondary | ICD-10-CM

## 2013-08-06 DIAGNOSIS — L02419 Cutaneous abscess of limb, unspecified: Secondary | ICD-10-CM

## 2013-08-06 MED ORDER — CEPHALEXIN 500 MG PO CAPS: 500.0000 mg | ORAL_CAPSULE | Freq: Four times a day (QID) | ORAL | Status: DC

## 2013-08-06 NOTE — Discharge Instructions (Signed)
Sarah Chen,  I think the above I got infected from scratching. Therefore you need to take an antibiotic 4 times a day for the next 7 days. Please take the entire course. Please return if the redness on her leg is worsening or he develop fevers or severe pain.  Take care.   Dr. Clinton SawyerWilliamson

## 2013-08-06 NOTE — ED Provider Notes (Signed)
CSN: [id number]     Arrival date & time 08/06/13  1855 History   First MD Initiated Contact with Patient 08/06/13 1913     Chief Complaint  Patient presents with  . Skin Problem   (Consider location/radiation/quality/duration/timing/severity/associated sxs/prior Treatment) HPI  Bug bite on RLE. Occurred 4 days ago and worsening. Painful and red. The redness is getting larger despite using Benadryl. She denies any drainage or vesicles. She has been scratching it a lot. She denies any fever or chills. She did have problems using her leg including her knee and her ankle. She also has a smaller lesion that she describes as an insect bite on her right medial thigh is much smaller. It also itches but is not getting larger.  Past Medical History  Diagnosis Date  . Anxiety   . Knee pain   . Anxiety   . Anxiety    Past Surgical History  Procedure Laterality Date  . Knee arthroscopy    . Knee surgery     Family History  Problem Relation Age of Onset  . Arthritis Mother   . Mental illness Mother   . Mental illness Father   . Asthma Sister   . Mental illness Brother   . Diabetes Paternal Grandmother   . Heart disease Paternal Grandmother   . Cancer Paternal Grandfather    History  Substance Use Topics  . Smoking status: Never Smoker   . Smokeless tobacco: Not on file  . Alcohol Use: Yes     Comment: occassionally   OB History   Grav Para Term Preterm Abortions TAB SAB Ect Mult Living                 Review of Systems Negative for fever, chills, nausea, vomiting Allergies  Review of patient's allergies indicates no known allergies.  Home Medications   Current Outpatient Rx  Name  Route  Sig  Dispense  Refill  . diclofenac (VOLTAREN) 75 MG EC tablet   Oral   Take 1 tablet (75 mg total) by mouth 3 (three) times daily as needed for mild pain.   30 tablet   0   . metroNIDAZOLE (FLAGYL) 500 MG tablet   Oral   Take 1 tablet (500 mg total) by mouth 2 (two) times daily.  14 tablet   0   . ondansetron (ZOFRAN-ODT) 8 MG disintegrating tablet   Oral   Take 1 tablet (8 mg total) by mouth every 8 (eight) hours as needed for nausea.   30 tablet   0    BP 126/80  Pulse 78  Temp(Src) 98.2 F (36.8 C) (Oral)  Resp 14  SpO2 100% Physical Exam Gen. Young white female, thin body habitus, non-ill-appearing Skin: 4 cm x 3 cm erythematous poorly demarcated plaque on right medial calf with no tenderness but no more warmth, no streaking erythema; right medial thigh with subcentimeter erythematous papule without surrounding redness, no drainage, no vesicles MSK: normal range of motion of right knee and right ankle  ED Course  Procedures (including critical care time) Labs Review Labs Reviewed - No data to display Imaging Review No results found.   MDM   1. Cellulitis of right leg    Does not appear to be MRSA, so i will give Keflex for 7 days. The area was marked and the patient was instructed to return in 48 hours if it is worsening.    Garnetta BuddyEdward V Keegan Bensch, MD 08/06/13 [phone]

## 2013-08-06 NOTE — ED Notes (Signed)
Concerned about 3 day duration of pain, itching on right lower leg x 2 sites. No relief w benadryl. Thinks they are bug bites of some kind

## 2013-08-06 NOTE — ED Provider Notes (Signed)
Medical screening examination/treatment/procedure(s) were performed by a resident physician and as supervising physician I was immediately available for consultation/collaboration.  Leslee Homeavid Meliza Kage, M.D.  Reuben Likesavid C Corinthian Mizrahi, MD 08/06/13 2139

## 2013-09-13 ENCOUNTER — Ambulatory Visit (INDEPENDENT_AMBULATORY_CARE_PROVIDER_SITE_OTHER): Payer: Medicare PPO | Admitting: Emergency Medicine

## 2013-09-13 VITALS — BP 102/64 | HR 99 | Temp 99.1°F | Resp 16 | Ht 65.75 in | Wt 123.0 lb

## 2013-09-13 DIAGNOSIS — N3000 Acute cystitis without hematuria: Secondary | ICD-10-CM

## 2013-09-13 DIAGNOSIS — R350 Frequency of micturition: Secondary | ICD-10-CM

## 2013-09-13 LAB — POCT URINALYSIS DIPSTICK
Bilirubin, UA: NEGATIVE
GLUCOSE UA: NEGATIVE
KETONES UA: NEGATIVE
Nitrite, UA: NEGATIVE
Protein, UA: NEGATIVE
Spec Grav, UA: >=1.03
Urobilinogen, UA: 0.2
pH, UA: 6

## 2013-09-13 LAB — POCT UA - MICROSCOPIC ONLY
CRYSTALS, UR, HPF, POC: NEGATIVE
Casts, Ur, LPF, POC: NEGATIVE
Yeast, UA: NEGATIVE

## 2013-09-13 MED ORDER — PHENAZOPYRIDINE HCL 200 MG PO TABS: 200.0000 mg | ORAL_TABLET | Freq: Three times a day (TID) | ORAL | Status: DC | PRN

## 2013-09-13 MED ORDER — SULFAMETHOXAZOLE-TMP DS 800-160 MG PO TABS: 1.0000 | ORAL_TABLET | Freq: Two times a day (BID) | ORAL | Status: DC

## 2013-09-13 NOTE — Patient Instructions (Signed)
Urinary Tract Infection  Urinary tract infections (UTIs) can develop anywhere along your urinary tract. Your urinary tract is your body's drainage system for removing wastes and extra water. Your urinary tract includes two kidneys, two ureters, a bladder, and a urethra. Your kidneys are a pair of bean-shaped organs. Each kidney is about the size of your fist. They are located below your ribs, one on each side of your spine.  CAUSES  Infections are caused by microbes, which are microscopic organisms, including fungi, viruses, and bacteria. These organisms are so small that they can only be seen through a microscope. Bacteria are the microbes that most commonly cause UTIs.  SYMPTOMS   Symptoms of UTIs may vary by age and gender of the patient and by the location of the infection. Symptoms in young women typically include a frequent and intense urge to urinate and a painful, burning feeling in the bladder or urethra during urination. Older women and men are more likely to be tired, shaky, and weak and have muscle aches and abdominal pain. A fever may mean the infection is in your kidneys. Other symptoms of a kidney infection include pain in your back or sides below the ribs, nausea, and vomiting.  DIAGNOSIS  To diagnose a UTI, your caregiver will ask you about your symptoms. Your caregiver also will ask to provide a urine sample. The urine sample will be tested for bacteria and white blood cells. White blood cells are made by your body to help fight infection.  TREATMENT   Typically, UTIs can be treated with medication. Because most UTIs are caused by a bacterial infection, they usually can be treated with the use of antibiotics. The choice of antibiotic and length of treatment depend on your symptoms and the type of bacteria causing your infection.  HOME CARE INSTRUCTIONS   If you were prescribed antibiotics, take them exactly as your caregiver instructs you. Finish the medication even if you feel better after you  have only taken some of the medication.   Drink enough water and fluids to keep your urine clear or pale yellow.   Avoid caffeine, tea, and carbonated beverages. They tend to irritate your bladder.   Empty your bladder often. Avoid holding urine for long periods of time.   Empty your bladder before and after sexual intercourse.   After a bowel movement, women should cleanse from front to back. Use each tissue only once.  SEEK MEDICAL CARE IF:    You have back pain.   You develop a fever.   Your symptoms do not begin to resolve within 3 days.  SEEK IMMEDIATE MEDICAL CARE IF:    You have severe back pain or lower abdominal pain.   You develop chills.   You have nausea or vomiting.   You have continued burning or discomfort with urination.  MAKE SURE YOU:    Understand these instructions.   Will watch your condition.   Will get help right away if you are not doing well or get worse.  Document Released: 01/30/2005 Document Revised: 10/22/2011 Document Reviewed: 05/31/2011  [employer] Patient Information 2014 [employer]

## 2013-09-13 NOTE — Progress Notes (Signed)
[facility] [address], [place] [id number] [phone]  Date:  09/13/2013   Name:  Sarah CiproChelsea Glab   DOB:  June 11, 1990   MRN:  [id number]  PCP:  Default, Provider, MD    Chief Complaint: Urinary Frequency   History of Present Illness:  Sarah Chen is a 23 y.o. very pleasant female patient who presents with the following:  Dysuria, urgency and frequency.  No fever or chills. No back pain.  No discharge, bleeding, or dyspareunia.  History of prior UTI.  No nausea or vomiting. No stool change.  No improvement with over the counter medications or other home remedies. Denies other complaint or health concern today.   There are no active problems to display for this patient.   Past Medical History  Diagnosis Date  . Anxiety   . Knee pain   . Anxiety   . Anxiety     Past Surgical History  Procedure Laterality Date  . Knee arthroscopy    . Knee surgery      History  Substance Use Topics  . Smoking status: Never Smoker   . Smokeless tobacco: Not on file  . Alcohol Use: Yes     Comment: occassionally    Family History  Problem Relation Age of Onset  . Arthritis Mother   . Mental illness Mother   . Mental illness Father   . Asthma Sister   . Mental illness Brother   . Diabetes Paternal Grandmother   . Heart disease Paternal Grandmother   . Cancer Paternal Grandfather     No Known Allergies  Medication list has been reviewed and updated.  Current Outpatient Prescriptions on File Prior to Visit  Medication Sig Dispense Refill  . cephALEXin (KEFLEX) 500 MG capsule Take 1 capsule (500 mg total) by mouth 4 (four) times daily.  28 capsule  0  . diclofenac (VOLTAREN) 75 MG EC tablet Take 1 tablet (75 mg total) by mouth 3 (three) times daily as needed for mild pain.  30 tablet  0  . metroNIDAZOLE (FLAGYL) 500 MG tablet Take 1 tablet (500 mg total) by mouth 2 (two) times daily.  14 tablet  0  . ondansetron (ZOFRAN-ODT) 8 MG disintegrating  tablet Take 1 tablet (8 mg total) by mouth every 8 (eight) hours as needed for nausea.  30 tablet  0   No current facility-administered medications on file prior to visit.    Review of Systems:  As per HPI, otherwise negative.    Physical Examination: Filed Vitals:   09/13/13 1552  BP: 102/64  Pulse: 99  Temp: 99.1 F (37.3 C)  Resp: 16   Filed Vitals:   09/13/13 1552  Height: 5' 5.75" (1.67 m)  Weight: 123 lb (55.792 kg)   Body mass index is 20.01 kg/(m^2). Ideal Body Weight: Weight in (lb) to have BMI = 25: 153.4  GEN: WDWN, NAD, Non-toxic, A & O x 3 HEENT: Atraumatic, Normocephalic. Neck supple. No masses, No LAD. Ears and Nose: No external deformity. CV: RRR, No M/G/R. No JVD. No thrill. No extra heart sounds. PULM: CTA B, no wheezes, crackles, rhonchi. No retractions. No resp. distress. No accessory muscle use. ABD: S, NT, ND, +BS. No rebound. No HSM. EXTR: No c/c/e NEURO Normal gait.  PSYCH: Normally interactive. Conversant. Not depressed or anxious appearing.  Calm demeanor.    Assessment and Plan: Acute cystitis Septra Pyridium  Signed,  Phillips OdorJeffery Anderson, MD  Results for orders placed in visit on  09/13/13  POCT UA - MICROSCOPIC ONLY      Result Value Ref Range   WBC, Ur, HPF, POC 0-3     RBC, urine, microscopic 25-30     Bacteria, U Microscopic 2+     Mucus, UA trace     Epithelial cells, urine per micros 0-2     Crystals, Ur, HPF, POC neg     Casts, Ur, LPF, POC neg     Yeast, UA neg    POCT URINALYSIS DIPSTICK      Result Value Ref Range   Color, UA yellow     Clarity, UA clear     Glucose, UA neg     Bilirubin, UA neg     Ketones, UA neg     Spec Grav, UA >=1.030     Blood, UA trace     pH, UA 6.0     Protein, UA neg     Urobilinogen, UA 0.2     Nitrite, UA neg     Leukocytes, UA small (1+)

## 2013-11-25 ENCOUNTER — Ambulatory Visit: Payer: Medicare PPO

## 2014-01-04 ENCOUNTER — Emergency Department (INDEPENDENT_AMBULATORY_CARE_PROVIDER_SITE_OTHER)
Admission: EM | Admit: 2014-01-04 | Discharge: 2014-01-04 | Disposition: A | Discharge status: Home / Self Care | Payer: 59 | Source: Home / Self Care | Attending: Family Medicine | Admitting: Family Medicine

## 2014-01-04 ENCOUNTER — Inpatient Hospital Stay (HOSPITAL_COMMUNITY)
Admission: AD | Admit: 2014-01-04 | Discharge: 2014-01-04 | Disposition: A | Discharge status: Home / Self Care | Payer: 59 | Source: Ambulatory Visit | Attending: Family Medicine | Admitting: Family Medicine

## 2014-01-04 ENCOUNTER — Inpatient Hospital Stay (HOSPITAL_COMMUNITY): Payer: 59

## 2014-01-04 ENCOUNTER — Encounter (HOSPITAL_COMMUNITY): Payer: Self-pay | Admitting: *Deleted

## 2014-01-04 ENCOUNTER — Encounter (HOSPITAL_COMMUNITY): Payer: Self-pay | Admitting: Emergency Medicine

## 2014-01-04 DIAGNOSIS — R1031 Right lower quadrant pain: Secondary | ICD-10-CM | POA: Insufficient documentation

## 2014-01-04 DIAGNOSIS — N949 Unspecified condition associated with female genital organs and menstrual cycle: Secondary | ICD-10-CM | POA: Diagnosis not present

## 2014-01-04 DIAGNOSIS — N946 Dysmenorrhea, unspecified: Secondary | ICD-10-CM | POA: Insufficient documentation

## 2014-01-04 DIAGNOSIS — N72 Inflammatory disease of cervix uteri: Secondary | ICD-10-CM | POA: Diagnosis not present

## 2014-01-04 DIAGNOSIS — R109 Unspecified abdominal pain: Secondary | ICD-10-CM

## 2014-01-04 LAB — POCT PREGNANCY, URINE: Preg Test, Ur: NEGATIVE

## 2014-01-04 LAB — URINALYSIS, ROUTINE W REFLEX MICROSCOPIC
BILIRUBIN URINE: NEGATIVE
Glucose, UA: NEGATIVE mg/dL
Hgb urine dipstick: NEGATIVE
Ketones, ur: NEGATIVE mg/dL
Leukocytes, UA: NEGATIVE
Nitrite: NEGATIVE
PH: 5.5 (ref 5.0–8.0)
Protein, ur: NEGATIVE mg/dL
UROBILINOGEN UA: 0.2 mg/dL (ref 0.0–1.0)

## 2014-01-04 LAB — CBC
HCT: 38.1 % (ref 36.0–46.0)
HEMOGLOBIN: 13 g/dL (ref 12.0–15.0)
MCH: 30.2 pg (ref 26.0–34.0)
MCHC: 34.1 g/dL (ref 30.0–36.0)
MCV: 88.6 fL (ref 78.0–100.0)
PLATELETS: 194 10*3/uL (ref 150–400)
RBC: 4.3 MIL/uL (ref 3.87–5.11)
RDW: 11.9 % (ref 11.5–15.5)
WBC: 8.4 10*3/uL (ref 4.0–10.5)

## 2014-01-04 LAB — WET PREP, GENITAL
CLUE CELLS WET PREP: NONE SEEN
Trich, Wet Prep: NONE SEEN
Yeast Wet Prep HPF POC: NONE SEEN

## 2014-01-04 MED ORDER — CEFTRIAXONE SODIUM 250 MG IJ SOLR
250.0000 mg | Freq: Once | INTRAMUSCULAR | Status: AC
  Administered 2014-01-04: 250 mg via INTRAMUSCULAR
  Filled 2014-01-04: qty 250

## 2014-01-04 MED ORDER — KETOROLAC TROMETHAMINE 60 MG/2ML IM SOLN
60.0000 mg | Freq: Once | INTRAMUSCULAR | Status: AC
  Administered 2014-01-04: 60 mg via INTRAMUSCULAR
  Filled 2014-01-04: qty 2

## 2014-01-04 MED ORDER — AZITHROMYCIN 250 MG PO TABS
1000.0000 mg | ORAL_TABLET | Freq: Once | ORAL | Status: AC
  Administered 2014-01-04: 1000 mg via ORAL
  Filled 2014-01-04: qty 4

## 2014-01-04 MED ORDER — NORGESTIMATE-ETH ESTRADIOL 0.25-35 MG-MCG PO TABS: ORAL_TABLET | ORAL | Status: DC

## 2014-01-04 NOTE — Discharge Instructions (Signed)
[facility] °

## 2014-01-04 NOTE — ED Provider Notes (Signed)
CSN: [id number]     Arrival date & time 01/04/14  1603 History   First MD Initiated Contact with Patient 01/04/14 1614     Chief Complaint  Patient presents with  . Abdominal Pain   (Consider location/radiation/quality/duration/timing/severity/associated sxs/prior Treatment) Patient is a 23 y.o. female presenting with abdominal pain. The history is provided by the patient.  Abdominal Pain Pain location:  Suprapubic Pain quality: cramping and sharp   Pain radiates to:  Does not radiate Pain severity:  Mild Onset quality:  Gradual Duration:  5 days Progression:  Unchanged Chronicity:  New Context: not sick contacts   Relieved by:  None tried Worsened by:  Nothing tried Ineffective treatments:  None tried Associated symptoms: diarrhea and nausea   Associated symptoms: no chills, no constipation, no dysuria, no fever, no hematuria, no vaginal bleeding, no vaginal discharge and no vomiting   Associated symptoms comment:  S/p depo for bc, stopped 5 mo ago, only with condoms, lmp 8/16   Past Medical History  Diagnosis Date  . Anxiety   . Knee pain   . Anxiety   . Anxiety    Past Surgical History  Procedure Laterality Date  . Knee arthroscopy    . Knee surgery     Family History  Problem Relation Age of Onset  . Arthritis Mother   . Mental illness Mother   . Mental illness Father   . Asthma Sister   . Mental illness Brother   . Diabetes Paternal Grandmother   . Heart disease Paternal Grandmother   . Cancer Paternal Grandfather    History  Substance Use Topics  . Smoking status: Never Smoker   . Smokeless tobacco: Not on file  . Alcohol Use: Yes     Comment: occassionally   OB History   Grav Para Term Preterm Abortions TAB SAB Ect Mult Living                 Review of Systems  Constitutional: Negative.  Negative for fever and chills.  Gastrointestinal: Positive for nausea, abdominal pain and diarrhea. Negative for vomiting and constipation.  Genitourinary:  Positive for menstrual problem and pelvic pain. Negative for dysuria, urgency, frequency, hematuria, flank pain, vaginal bleeding and vaginal discharge.    Allergies  Review of patient's allergies indicates no known allergies.  Home Medications   Prior to Admission medications   Medication Sig Start Date End Date Taking? Authorizing Provider  cephALEXin (KEFLEX) 500 MG capsule Take 1 capsule (500 mg total) by mouth 4 (four) times daily. 08/06/13   Garnetta Buddy, MD  diclofenac (VOLTAREN) 75 MG EC tablet Take 1 tablet (75 mg total) by mouth 3 (three) times daily as needed for mild pain. 05/01/13   [name], MD  metroNIDAZOLE (FLAGYL) 500 MG tablet Take 1 tablet (500 mg total) by mouth 2 (two) times daily. 05/01/13   [name], MD  ondansetron (ZOFRAN-ODT) 8 MG disintegrating tablet Take 1 tablet (8 mg total) by mouth every 8 (eight) hours as needed for nausea. 05/01/13   [name], MD  phenazopyridine (PYRIDIUM) 200 MG tablet Take 1 tablet (200 mg total) by mouth 3 (three) times daily as needed for pain. 09/13/13   Carmelina Dane, MD  sulfamethoxazole-trimethoprim (BACTRIM DS) 800-160 MG per tablet Take 1 tablet by mouth 2 (two) times daily. 09/13/13   Carmelina Dane, MD   BP 126/88  Pulse 95  Temp(Src) 98.9 F (37.2 C) (Oral)  Resp 12  SpO2  98%  LMP 12/19/2013 Physical Exam  Nursing note and vitals reviewed. Constitutional: She is oriented to person, place, and time. She appears well-developed and well-nourished. No distress.  Abdominal: Soft. Bowel sounds are normal. She exhibits no distension and no mass. There is no tenderness. There is no rebound and no guarding.  Neurological: She is alert and oriented to person, place, and time.  Skin: Skin is warm and dry.    ED Course  Procedures (including critical care time) Labs Review Labs Reviewed  POCT PREGNANCY, URINE    Imaging Review No results found.   MDM   Abdominal pain in female patient  Discussed  with susan lineberry, np, will see at [facility].     Linna Hoff, MD 01/04/14 [phone]

## 2014-01-04 NOTE — ED Notes (Signed)
Pt  Reports  Low  abd  Pain  With nausea  No  Vomiting       denys  Any  Vaginal  Bleeding  /  Discharge     Pt      Ambulates     Upright  With  A  Steady  Fluid  Gait        Appearing in  No  Acute  Distress

## 2014-01-04 NOTE — ED Notes (Signed)
REPORT  PHONED   TO      NURSE          AT  [facility]

## 2014-01-04 NOTE — MAU Note (Signed)
Pain started on Thurs, initially  Was pressure below the umbilicus. Has gotten worse, now is a stabbing pain, esp on RLQ. Was seen at Urgent care today, sent here for further eval.

## 2014-01-04 NOTE — MAU Provider Note (Signed)
History     CSN: [id number]  Arrival date and time: 01/04/14 1712   None     Chief Complaint  Patient presents with  . Abdominal Pain   HPI Pt is not pregnant and was sent from Urgent Care with Dr. Melrose Nakayama for evaluation of abdominal pain. Pt states she has had really bad cramps and was put on OCs when she was 23 years old.  She then was switched to Depo but stopped in January. Pt is using condoms for contraception. Pt has been having RCM and also cramps with her periods but has gotten worse.  Pt takes Ibuprofen  sometimes 5 times daily.    RN note: Pain started on Thurs, initially Was pressure below the umbilicus. Has gotten worse, now is a stabbing pain, esp on RLQ. Was seen at Urgent care today, sent here for further eval.       Past Medical History  Diagnosis Date  . Anxiety   . Knee pain   . Anxiety   . Anxiety     Past Surgical History  Procedure Laterality Date  . Knee arthroscopy    . Knee surgery      Family History  Problem Relation Age of Onset  . Arthritis Mother   . Mental illness Mother   . Mental illness Father   . Asthma Sister   . Mental illness Brother   . Diabetes Paternal Grandmother   . Heart disease Paternal Grandmother   . Cancer Paternal Grandfather     History  Substance Use Topics  . Smoking status: Never Smoker   . Smokeless tobacco: Not on file  . Alcohol Use: Yes     Comment: occassionally    Allergies: No Known Allergies  Prescriptions prior to admission  Medication Sig Dispense Refill  . cephALEXin (KEFLEX) 500 MG capsule Take 1 capsule (500 mg total) by mouth 4 (four) times daily.  28 capsule  0  . diclofenac (VOLTAREN) 75 MG EC tablet Take 1 tablet (75 mg total) by mouth 3 (three) times daily as needed for mild pain.  30 tablet  0  . metroNIDAZOLE (FLAGYL) 500 MG tablet Take 1 tablet (500 mg total) by mouth 2 (two) times daily.  14 tablet  0  . ondansetron (ZOFRAN-ODT) 8 MG disintegrating tablet Take 1 tablet (8 mg  total) by mouth every 8 (eight) hours as needed for nausea.  30 tablet  0  . phenazopyridine (PYRIDIUM) 200 MG tablet Take 1 tablet (200 mg total) by mouth 3 (three) times daily as needed for pain.  6 tablet  0  . sulfamethoxazole-trimethoprim (BACTRIM DS) 800-160 MG per tablet Take 1 tablet by mouth 2 (two) times daily.  20 tablet  0    Review of Systems  Constitutional: Negative for fever and chills.  Gastrointestinal: Positive for abdominal pain. Negative for nausea, vomiting, diarrhea and constipation.  Genitourinary: Negative for dysuria and urgency.  Neurological: Negative for dizziness and tingling.   Physical Exam   Last menstrual period 12/19/2013.  Physical Exam  Nursing note and vitals reviewed. Constitutional: She is oriented to person, place, and time. She appears well-developed and well-nourished. No distress.  HENT:  Head: Normocephalic.  Neck: Normal range of motion. Neck supple.  Cardiovascular: Normal rate.   Respiratory: Effort normal.  GI: Soft. She exhibits no distension. There is tenderness. There is no rebound and no guarding.  Genitourinary:  Small amount of watery discharge in vault; cervix clean, tender with CMT; uterus tender with exam;  adnexa without palpable enlargement- diffusely tender exam  Musculoskeletal: Normal range of motion.  Neurological: She is alert and oriented to person, place, and time.  Skin: Skin is warm and dry.  Psychiatric: She has a normal mood and affect.    MAU Course  Procedures Results for orders placed during the hospital encounter of 01/04/14 (from the past 24 hour(s))  URINALYSIS, ROUTINE W REFLEX MICROSCOPIC     Status: Abnormal   Collection Time    01/04/14  5:30 PM      Result Value Ref Range   Color, Urine YELLOW  YELLOW   APPearance CLEAR  CLEAR   Specific Gravity, Urine >1.030 (*) 1.005 - 1.030   pH 5.5  5.0 - 8.0   Glucose, UA NEGATIVE  NEGATIVE mg/dL   Hgb urine dipstick NEGATIVE  NEGATIVE   Bilirubin Urine  NEGATIVE  NEGATIVE   Ketones, ur NEGATIVE  NEGATIVE mg/dL   Protein, ur NEGATIVE  NEGATIVE mg/dL   Urobilinogen, UA 0.2  0.0 - 1.0 mg/dL   Nitrite NEGATIVE  NEGATIVE   Leukocytes, UA NEGATIVE  NEGATIVE  CBC     Status: None   Collection Time    01/04/14  6:10 PM      Result Value Ref Range   WBC 8.4  4.0 - 10.5 K/uL   RBC 4.30  3.87 - 5.11 MIL/uL   Hemoglobin 13.0  12.0 - 15.0 g/dL   HCT 82.9  56.2 - 13.0 %   MCV 88.6  78.0 - 100.0 fL   MCH 30.2  26.0 - 34.0 pg   MCHC 34.1  30.0 - 36.0 g/dL   RDW 86.5  78.4 - 69.6 %   Platelets 194  150 - 400 K/uL  WET PREP, GENITAL     Status: Abnormal   Collection Time    01/04/14  6:25 PM      Result Value Ref Range   Yeast Wet Prep HPF POC NONE SEEN  NONE SEEN   Trich, Wet Prep NONE SEEN  NONE SEEN   Clue Cells Wet Prep HPF POC NONE SEEN  NONE SEEN   WBC, Wet Prep HPF POC MODERATE (*) NONE SEEN  toradol  IM Zithromax 1 gm Rocephin  IM US Transvaginal Non-ob  01/04/2014   CLINICAL DATA:  Pelvic pain  EXAM: TRANSABDOMINAL AND TRANSVAGINAL ULTRASOUND OF PELVIS  TECHNIQUE: Both transabdominal and transvaginal ultrasound examinations of the pelvis were performed. Transabdominal technique was performed for global imaging of the pelvis including uterus, ovaries, adnexal regions, and pelvic cul-de-sac. It was necessary to proceed with endovaginal exam following the transabdominal exam to visualize the ovaries.  COMPARISON:  None  FINDINGS: Uterus  Measurements: 7 x 2.4 x 4.3 cm. No fibroids or other mass visualized.  Endometrium  Thickness: 4.2 mm.  No focal abnormality visualized.  Right ovary  Measurements: 3.9 x 2.0 x 1.8 cm. Normal appearance/no adnexal mass.  Left ovary  Measurements: 3.3 x 2.8 x 2.6 cm. Normal appearance/no adnexal mass.  Other findings  Small amount of free fluid.  IMPRESSION: 1. Essentially normal pelvic sonogram. 2. Free fluid within the pelvis may be physiologic in a premenopausal female.   Electronically Signed   By:  Signa Kell M.D.   On: 01/04/2014 19:19   US Pelvis Complete  01/04/2014   CLINICAL DATA:  Pelvic pain  EXAM: TRANSABDOMINAL AND TRANSVAGINAL ULTRASOUND OF PELVIS  TECHNIQUE: Both transabdominal and transvaginal ultrasound examinations of the pelvis were performed. Transabdominal technique was performed for global  imaging of the pelvis including uterus, ovaries, adnexal regions, and pelvic cul-de-sac. It was necessary to proceed with endovaginal exam following the transabdominal exam to visualize the ovaries.  COMPARISON:  None  FINDINGS: Uterus  Measurements: 7 x 2.4 x 4.3 cm. No fibroids or other mass visualized.  Endometrium  Thickness: 4.2 mm.  No focal abnormality visualized.  Right ovary  Measurements: 3.9 x 2.0 x 1.8 cm. Normal appearance/no adnexal mass.  Left ovary  Measurements: 3.3 x 2.8 x 2.6 cm. Normal appearance/no adnexal mass.  Other findings  Small amount of free fluid.  IMPRESSION: 1. Essentially normal pelvic sonogram. 2. Free fluid within the pelvis may be physiologic in a premenopausal female.   Electronically Signed   By: Signa Kell M.D.   On: 01/04/2014 19:19  GC/Chlamydia pending Assessment and Plan  Pelvic pain Cervicitis Dysmenorrhea- Sprintec 1 daily continuously F/u with GYN clinic- GYN clinic to call pt for appt  Chinara Hertzberg 01/04/2014, 5:51 PM

## 2014-01-05 LAB — GC/CHLAMYDIA PROBE AMP
CT PROBE, AMP APTIMA: NEGATIVE
GC Probe RNA: NEGATIVE

## 2014-01-05 LAB — POCT URINALYSIS DIP (DEVICE)
BILIRUBIN URINE: NEGATIVE
Glucose, UA: NEGATIVE mg/dL
HGB URINE DIPSTICK: NEGATIVE
Ketones, ur: NEGATIVE mg/dL
LEUKOCYTES UA: NEGATIVE
NITRITE: NEGATIVE
PH: 6 (ref 5.0–8.0)
Protein, ur: NEGATIVE mg/dL
Specific Gravity, Urine: 1.015 (ref 1.005–1.030)
UROBILINOGEN UA: 0.2 mg/dL (ref 0.0–1.0)

## 2014-01-05 NOTE — MAU Provider Note (Signed)
Attestation of Attending Supervision of Advanced Practitioner (PA/CNM/NP): Evaluation and management procedures were performed by the Advanced Practitioner under my supervision and collaboration.  I have reviewed the Advanced Practitioner's note and chart, and I agree with the management and plan.  Tytianna Greenley, DO Attending Physician Faculty Practice, [facility]  

## 2014-02-11 ENCOUNTER — Encounter: Payer: 59 | Admitting: Obstetrics & Gynecology

## 2014-03-17 ENCOUNTER — Ambulatory Visit (INDEPENDENT_AMBULATORY_CARE_PROVIDER_SITE_OTHER): Payer: 59 | Admitting: Obstetrics & Gynecology

## 2014-03-17 ENCOUNTER — Encounter: Payer: Self-pay | Admitting: Obstetrics & Gynecology

## 2014-03-17 VITALS — BP 126/90 | HR 107 | Temp 98.8°F | Ht 66.0 in | Wt 120.1 lb

## 2014-03-17 DIAGNOSIS — R102 Pelvic and perineal pain: Secondary | ICD-10-CM

## 2014-03-17 DIAGNOSIS — Z308 Encounter for other contraceptive management: Secondary | ICD-10-CM

## 2014-03-17 DIAGNOSIS — G8929 Other chronic pain: Secondary | ICD-10-CM | POA: Insufficient documentation

## 2014-03-17 DIAGNOSIS — Z30013 Encounter for initial prescription of injectable contraceptive: Secondary | ICD-10-CM | POA: Insufficient documentation

## 2014-03-17 LAB — POCT PREGNANCY, URINE: Preg Test, Ur: NEGATIVE

## 2014-03-17 MED ORDER — MEDROXYPROGESTERONE ACETATE 104 MG/0.65ML ~~LOC~~ SUSP
104.0000 mg | Freq: Once | SUBCUTANEOUS | Status: AC
  Administered 2014-03-17: 104 mg via SUBCUTANEOUS

## 2014-03-17 NOTE — Progress Notes (Signed)
Patient ID: Sarah CiproChelsea Chen, female   DOB: 12-28-1990, 23 y.o.   MRN: [id number]  Chief Complaint  Patient presents with  . Follow-up    from MAU  . Dysmenorrhea    also heavy; been on OCPs since Sept.    HPI Sarah CiproChelsea Joy is a 23 y.o. female.  G0P0 Patient's last menstrual period was 03/17/2014. On Sprintec  And has BTB taking them continuously. No improvement of RLQ pain daily since 05/2013  HPI  Past Medical History  Diagnosis Date  . Anxiety   . Knee pain   . Anxiety   . Anxiety   PID per Pt  Past Surgical History  Procedure Laterality Date  . Knee arthroscopy    . Knee surgery      Family History  Problem Relation Age of Onset  . Arthritis Mother   . Mental illness Mother   . Mental illness Father   . Asthma Sister   . Mental illness Brother   . Diabetes Paternal Grandmother   . Heart disease Paternal Grandmother   . Cancer Paternal Grandfather     Social History History  Substance Use Topics  . Smoking status: Never Smoker   . Smokeless tobacco: Not on file  . Alcohol Use: Yes     Comment: occassionally   Singe, sexually active, needs contraception No Known Allergies  Current Outpatient Prescriptions  Medication Sig Dispense Refill  . norgestimate-ethinyl estradiol (ORTHO-CYCLEN,SPRINTEC,PREVIFEM) 0.25-35 MG-MCG tablet Take one pill continuously 1 Package 11   Current Facility-Administered Medications  Medication Dose Route Frequency Provider Last Rate Last Dose  . medroxyPROGESTERone (DEPO-SUBQ PROVERA 104) injection 104 mg  104 mg Subcutaneous Once Adam PhenixJames G Alexah Kivett, MD        Review of Systems Review of Systems  Constitutional: Negative.   Respiratory: Negative.   Genitourinary: Positive for menstrual problem and pelvic pain. Negative for dysuria, urgency, vaginal bleeding, vaginal discharge and enuresis.       Small bumps at vulva  Skin: Negative.     Blood pressure 126/90, pulse 107, temperature 98.8 F (37.1 C), temperature source Oral,  height 5\' 6"  (1.676 m), weight 54.477 kg (120 lb 1.6 oz), last menstrual period 03/17/2014.  Physical Exam Physical Exam  Constitutional: She is oriented to person, place, and time. She appears well-developed. No distress.  Cardiovascular: Normal rate.   Pulmonary/Chest: Effort normal. No respiratory distress.  Abdominal: Soft. She exhibits no distension and no mass. There is no tenderness.  Genitourinary: Vagina normal and uterus normal. No vaginal discharge found.  Mild right side tenderness no mass  Neurological: She is alert and oriented to person, place, and time.  Skin: Skin is warm and dry.  Psychiatric: She has a normal mood and affect. Her behavior is normal.    Data Reviewed  CLINICAL DATA: Pelvic pain  EXAM: TRANSABDOMINAL AND TRANSVAGINAL ULTRASOUND OF PELVIS  TECHNIQUE: Both transabdominal and transvaginal ultrasound examinations of the pelvis were performed. Transabdominal technique was performed for global imaging of the pelvis including uterus, ovaries, adnexal regions, and pelvic cul-de-sac. It was necessary to proceed with endovaginal exam following the transabdominal exam to visualize the ovaries.  COMPARISON: None  FINDINGS: Uterus  Measurements: 7 x 2.4 x 4.3 cm. No fibroids or other mass visualized.  Endometrium  Thickness: 4.2 mm. No focal abnormality visualized.  Right ovary  Measurements: 3.9 x 2.0 x 1.8 cm. Normal appearance/no adnexal mass.  Left ovary  Measurements: 3.3 x 2.8 x 2.6 cm. Normal appearance/no adnexal mass.  Other findings  Small amount of free fluid.  IMPRESSION: 1. Essentially normal pelvic sonogram. 2. Free fluid within the pelvis may be physiologic in a premenopausal female.   Electronically Signed  By: Signa Kellaylor Stroud M.D.  On: 01/04/2014 19:19  Assessment    Pelvic pain since her last DMPA injection   Possible endometriosis  Plan    RTC 3 mo, restart depo provera today         Devlynn Knoff 03/17/2014, 1:46 PM

## 2014-03-17 NOTE — Patient Instructions (Signed)
Pelvic Pain Female pelvic pain can be caused by many different things and start from a variety of places. Pelvic pain refers to pain that is located in the lower half of the abdomen and between your hips. The pain may occur over a short period of time (acute) or may be reoccurring (chronic). The cause of pelvic pain may be related to disorders affecting the female reproductive organs (gynecologic), but it may also be related to the bladder, kidney stones, an intestinal complication, or muscle or skeletal problems. Getting help right away for pelvic pain is important, especially if there has been severe, sharp, or a sudden onset of unusual pain. It is also important to get help right away because some types of pelvic pain can be life threatening.  CAUSES  Below are only some of the causes of pelvic pain. The causes of pelvic pain can be in one of several categories.   Gynecologic.  Pelvic inflammatory disease.  Sexually transmitted infection.  Ovarian cyst or a twisted ovarian ligament (ovarian torsion).  Uterine lining that grows outside the uterus (endometriosis).  Fibroids, cysts, or tumors.  Ovulation.  Pregnancy.  Pregnancy that occurs outside the uterus (ectopic pregnancy).  Miscarriage.  Labor.  Abruption of the placenta or ruptured uterus.  Infection.  Uterine infection (endometritis).  Bladder infection.  Diverticulitis.  Miscarriage related to a uterine infection (septic abortion).  Bladder.  Inflammation of the bladder (cystitis).  Kidney stone(s).  Gastrointestinal.  Constipation.  Diverticulitis.  Neurologic.  Trauma.  Feeling pelvic pain because of mental or emotional causes (psychosomatic).  Cancers of the bowel or pelvis. EVALUATION  Your caregiver will want to take a careful history of your concerns. This includes recent changes in your health, a careful gynecologic history of your periods (menses), and a sexual history. Obtaining your family  history and medical history is also important. Your caregiver may suggest a pelvic exam. A pelvic exam will help identify the location and severity of the pain. It also helps in the evaluation of which organ system may be involved. In order to identify the cause of the pelvic pain and be properly treated, your caregiver may order tests. These tests may include:   A pregnancy test.  Pelvic ultrasonography.  An X-ray exam of the abdomen.  A urinalysis or evaluation of vaginal discharge.  Blood tests. HOME CARE INSTRUCTIONS   Only take over-the-counter or prescription medicines for pain, discomfort, or fever as directed by your caregiver.   Rest as directed by your caregiver.   Eat a balanced diet.   Drink enough fluids to make your urine clear or pale yellow, or as directed.   Avoid sexual intercourse if it causes pain.   Apply warm or cold compresses to the lower abdomen depending on which one helps the pain.   Avoid stressful situations.   Keep a journal of your pelvic pain. Write down when it started, where the pain is located, and if there are things that seem to be associated with the pain, such as food or your menstrual cycle.  Follow up with your caregiver as directed.  SEEK MEDICAL CARE IF:  Your medicine does not help your pain.  You have abnormal vaginal discharge. SEEK IMMEDIATE MEDICAL CARE IF:   You have heavy bleeding from the vagina.   Your pelvic pain increases.   You feel light-headed or faint.   You have chills.   You have pain with urination or blood in your urine.   You have uncontrolled diarrhea   or vomiting.   You have a fever or persistent symptoms for more than 3 days.  You have a fever and your symptoms suddenly get worse.   You are being physically or sexually abused.  MAKE SURE YOU:  Understand these instructions.  Will watch your condition.  Will get help if you are not doing well or get worse. Document Released:  03/19/2004 Document Revised: 09/06/2013 Document Reviewed: 08/12/2011 Exit[employer] Patient Information 2015 [employer]. This information is not intended to replace advice given to you by your health care provider. Make sure you discuss any questions you have with your health care provider.  

## 2014-04-20 ENCOUNTER — Telehealth: Payer: Self-pay | Admitting: *Deleted

## 2014-04-20 NOTE — Telephone Encounter (Addendum)
Pt left message stating that she was seen in November and was given Depo Provera injection to help with her pelvic pain. She is still having pain and feels no improvement. She wants to know if she needs another appt.  12/17  Message sent to Dr. Debroah LoopArnold for review and recommendation. Pt will be contacted once a response is received.

## 2014-05-02 ENCOUNTER — Telehealth: Payer: Self-pay | Admitting: General Practice

## 2014-05-02 NOTE — Telephone Encounter (Signed)
Message forwarded to front office to schedule appt. Called patient and discussed with her that Dr Debroah LoopArnold would like for her to come back in for follow up since she is still having pain. Patient verbalized understanding and had no questions

## 2014-05-02 NOTE — Telephone Encounter (Signed)
-----   Message from Adam PhenixJames G Arnold, MD sent at 04/30/2014  5:27 PM EST ----- Schedule f/u appt

## 2014-06-08 ENCOUNTER — Ambulatory Visit: Payer: 59

## 2014-06-08 ENCOUNTER — Ambulatory Visit (INDEPENDENT_AMBULATORY_CARE_PROVIDER_SITE_OTHER): Payer: 59 | Admitting: Obstetrics & Gynecology

## 2014-06-08 ENCOUNTER — Encounter: Payer: Self-pay | Admitting: Obstetrics & Gynecology

## 2014-06-08 VITALS — BP 116/81 | HR 98 | Temp 98.0°F | Ht 66.0 in | Wt 123.8 lb

## 2014-06-08 DIAGNOSIS — R102 Pelvic and perineal pain: Secondary | ICD-10-CM

## 2014-06-08 DIAGNOSIS — Z3042 Encounter for surveillance of injectable contraceptive: Secondary | ICD-10-CM

## 2014-06-08 MED ORDER — MEDROXYPROGESTERONE ACETATE 104 MG/0.65ML ~~LOC~~ SUSP
104.0000 mg | Freq: Once | SUBCUTANEOUS | Status: AC
  Administered 2014-06-08: 104 mg via SUBCUTANEOUS

## 2014-06-08 MED ORDER — AMITRIPTYLINE HCL 10 MG PO TABS: 10.0000 mg | ORAL_TABLET | Freq: Every day | ORAL | Status: DC

## 2014-06-08 NOTE — Patient Instructions (Signed)

## 2014-06-08 NOTE — Progress Notes (Signed)
Expand All Collapse All   Patient ID: Sarah Chen, female DOB: Jul 25, 1990, 24 y.o. MRN: [id number]  Chief Complaint  Patient presents with  . Follow-up    from 03/18/14 after DMPA for pelvic pain  . Dysmenorrhea    also heavy; been on OCPs since Sept.    HPI Sarah Chen is a 24 y.o. female. G0P0 Patient's last menstrual period was 03/17/2014. Less dysmenorrhea with depo provera but daily RLQ pain takes ibuprofen Past Medical History  Diagnosis Date  . Anxiety   . Knee pain   . Anxiety   . Anxiety   PID per Pt  Past Surgical History  Procedure Laterality Date  . Knee arthroscopy    . Knee surgery      Family History  Problem Relation Age of Onset  . Arthritis Mother   . Mental illness Mother   . Mental illness Father   . Asthma Sister   . Mental illness Brother   . Diabetes Paternal Grandmother   . Heart disease Paternal Grandmother   . Cancer Paternal Grandfather     Social History History  Substance Use Topics  . Smoking status: Never Smoker   . Smokeless tobacco: Not on file  . Alcohol Use: Yes     Comment: occassionally   Singe, sexually active, needs contraception No Known Allergies  Current Outpatient Prescriptions  Medication Sig Dispense Refill  . norgestimate-ethinyl estradiol (ORTHO-CYCLEN,SPRINTEC,PREVIFEM) 0.25-35 MG-MCG tablet Take one pill continuously 1 Package 11   Current Facility-Administered Medications  Medication Dose Route Frequency Provider Last Rate Last Dose  . medroxyPROGESTERone (DEPO-SUBQ PROVERA 104) injection 104 mg 104 mg Subcutaneous Once Adam Phenix, MD      Review of Systems Review of Systems  Constitutional: Negative.  Respiratory: Negative.  Genitourinary: Positive for menstrual problem and pelvic pain. Negative for dysuria, urgency, vaginal bleeding, vaginal discharge and  enuresis.   Skin: Negative.    Blood pressure 126/90, pulse 107, temperature 98.8 F (37.1 C), temperature source Oral, height  (1.676 m), weight 54.477 kg (120 lb 1.6 oz), last menstrual period 03/17/2014.  Physical Exam Physical Exam  Constitutional: She is oriented to person, place, and time. She appears well-developed. No distress.  Cardiovascular: Normal rate.  Pulmonary/Chest: Effort normal. No respiratory distress.  Neurological: She is alert and oriented to person, place, and time.  Skin: Skin is warm and dry.  Psychiatric: She has a normal mood and affect. Her behavior is normal.    Data Reviewed  CLINICAL DATA: Pelvic pain  EXAM: TRANSABDOMINAL AND TRANSVAGINAL ULTRASOUND OF PELVIS  TECHNIQUE: Both transabdominal and transvaginal ultrasound examinations of the pelvis were performed. Transabdominal technique was performed for global imaging of the pelvis including uterus, ovaries, adnexal regions, and pelvic cul-de-sac. It was necessary to proceed with endovaginal exam following the transabdominal exam to visualize the ovaries.  COMPARISON: None  FINDINGS: Uterus  Measurements: 7 x 2.4 x 4.3 cm. No fibroids or other mass visualized.  Endometrium  Thickness: 4.2 mm. No focal abnormality visualized.  Right ovary  Measurements: 3.9 x 2.0 x 1.8 cm. Normal appearance/no adnexal mass.  Left ovary  Measurements: 3.3 x 2.8 x 2.6 cm. Normal appearance/no adnexal mass.  Other findings  Small amount of free fluid.  IMPRESSION: 1. Essentially normal pelvic sonogram. 2. Free fluid within the pelvis may be physiologic in a premenopausal female.   Electronically Signed  By: Signa Kell M.D.  On: 01/04/2014 19:19  Assessment    Pelvic pain continues since her last DMPA  injection   Possible endometriosis  Plan    Needs dx laparoscopy, the procedure and risk including no diagnosis,  the risk of anesthesia, bleeding,  infection, bowel and bladder injury,were discussed and her questions were answered. The procedure will be scheduled as an outpatient.     Adam PhenixJames G Navjot Loera, MD 06/08/2014

## 2014-06-21 ENCOUNTER — Encounter (HOSPITAL_COMMUNITY)
Admission: RE | Admit: 2014-06-21 | Discharge: 2014-06-21 | Disposition: A | Discharge status: Home / Self Care | Payer: 59 | Source: Ambulatory Visit | Attending: Obstetrics & Gynecology | Admitting: Obstetrics & Gynecology

## 2014-06-21 ENCOUNTER — Encounter (HOSPITAL_COMMUNITY): Payer: Self-pay

## 2014-06-21 DIAGNOSIS — R102 Pelvic and perineal pain: Secondary | ICD-10-CM | POA: Insufficient documentation

## 2014-06-21 DIAGNOSIS — Z01818 Encounter for other preprocedural examination: Secondary | ICD-10-CM | POA: Insufficient documentation

## 2014-06-21 LAB — CBC
HCT: 39.1 % (ref 36.0–46.0)
Hemoglobin: 13.1 g/dL (ref 12.0–15.0)
MCH: 29.6 pg (ref 26.0–34.0)
MCHC: 33.5 g/dL (ref 30.0–36.0)
MCV: 88.5 fL (ref 78.0–100.0)
PLATELETS: 205 10*3/uL (ref 150–400)
RBC: 4.42 MIL/uL (ref 3.87–5.11)
RDW: 12.2 % (ref 11.5–15.5)
WBC: 10.8 10*3/uL — AB (ref 4.0–10.5)

## 2014-06-21 NOTE — Patient Instructions (Signed)
Your procedure is scheduled on:06/24/14  Enter through the Main Entrance at :11am Pick up desk phone and dial [phone] and inform us of your arrival.  Please call [phone] if you have any problems the morning of surgery.  Remember: Do not eat food after midnight:Thursday Clear liquids are ok until:8am on Friday   You may brush your teeth the morning of surgery.   DO NOT wear jewelry, eye make-up, lipstick,body lotion, or dark fingernail polish.  (Polished toes are ok) You may wear deodorant.  If you are to be admitted after surgery, leave suitcase in car until your room has been assigned. Patients discharged on the day of surgery will not be allowed to drive home. Wear loose fitting, comfortable clothes for your ride home.

## 2014-06-27 NOTE — Anesthesia Preprocedure Evaluation (Addendum)
Anesthesia Evaluation  Patient identified by MRN, date of birth, ID band Patient awake    Reviewed: Allergy & Precautions, NPO status , Patient's Chart, lab work & pertinent test results  History of Anesthesia Complications Negative for: history of anesthetic complications  Airway Mallampati: II  TM Distance: >3 FB Neck ROM: Full    Dental no notable dental hx. (+) Dental Advisory Given   Pulmonary neg pulmonary ROS,  breath sounds clear to auscultation  Pulmonary exam normal       Cardiovascular negative cardio ROS  Rhythm:Regular Rate:Normal     Neuro/Psych PSYCHIATRIC DISORDERS Anxiety negative neurological ROS     GI/Hepatic negative GI ROS, Neg liver ROS,   Endo/Other  negative endocrine ROS  Renal/GU negative Renal ROS  negative genitourinary   Musculoskeletal negative musculoskeletal ROS (+)   Abdominal   Peds negative pediatric ROS (+)  Hematology negative hematology ROS (+)   Anesthesia Other Findings   Reproductive/Obstetrics negative OB ROS                            Anesthesia Physical Anesthesia Plan  ASA: II  Anesthesia Plan: General   Post-op Pain Management:    Induction: Intravenous  Airway Management Planned: Oral ETT  Additional Equipment:   Intra-op Plan:   Post-operative Plan: Extubation in OR  Informed Consent: I have reviewed the patients History and Physical, chart, labs and discussed the procedure including the risks, benefits and alternatives for the proposed anesthesia with the patient or authorized representative who has indicated his/her understanding and acceptance.   Dental advisory given  Plan Discussed with: CRNA  Anesthesia Plan Comments:         Anesthesia Quick Evaluation

## 2014-06-27 NOTE — H&P (Signed)
Patient ID: Sarah Chen, female DOB: 1990-07-27, 24 y.o. MRN: [id number]  Chief Complaint  Patient presents with  . Follow-up    from 03/18/14 after DMPA for pelvic pain  . Dysmenorrhea    also heavy; been on OCPs since Sept.    HPI Sarah Chen is a 24 y.o. female. G0P0 Patient's last menstrual period was 03/17/14, on DMPA Less dysmenorrhea with depo provera but daily RLQ pain takes ibuprofen Past Medical History  Diagnosis Date  . Anxiety   . Knee pain   . Anxiety   . Anxiety   PID per Pt  Past Surgical History  Procedure Laterality Date  . Knee arthroscopy    . Knee surgery      Family History  Problem Relation Age of Onset  . Arthritis Mother   . Mental illness Mother   . Mental illness Father   . Asthma Sister   . Mental illness Brother   . Diabetes Paternal Grandmother   . Heart disease Paternal Grandmother   . Cancer Paternal Grandfather     Social History History  Substance Use Topics  . Smoking status: Never Smoker   . Smokeless tobacco: Not on file  . Alcohol Use: Yes     Comment: occassionally   Singe, sexually active, needs contraception No Known Allergies  Current Outpatient Prescriptions  Medication Sig Dispense Refill  . norgestimate-ethinyl estradiol (ORTHO-CYCLEN,SPRINTEC,PREVIFEM) 0.25-35 MG-MCG tablet Take one pill continuously 1 Package 11   Current Facility-Administered Medications  Medication Dose Route Frequency Provider Last Rate Last Dose  . medroxyPROGESTERone (DEPO-SUBQ PROVERA 104) injection 104 mg 104 mg Subcutaneous Once Adam Phenix, MD      Review of Systems Review of Systems  Constitutional: Negative.  Respiratory: Negative.  Genitourinary: Positive for menstrual  problem and pelvic pain. Negative for dysuria, urgency, vaginal bleeding, vaginal discharge and enuresis.   Skin: Negative.    Blood pressure 126/90, pulse 107, temperature 98.8 F (37.1 C), temperature source Oral, height  (1.676 m), weight 54.477 kg (120 lb 1.6 oz), last menstrual period 03/17/2014.  Physical Exam Physical Exam  Constitutional: She is oriented to person, place, and time. She appears well-developed. No distress.  Cardiovascular: Normal rate.  Pulmonary/Chest: Effort normal. No respiratory distress.  Neurological: She is alert and oriented to person, place, and time.  Skin: Skin is warm and dry.  Psychiatric: She has a normal mood and affect. Her behavior is normal.    Data Reviewed  CLINICAL DATA: Pelvic pain  EXAM: TRANSABDOMINAL AND TRANSVAGINAL ULTRASOUND OF PELVIS  TECHNIQUE: Both transabdominal and transvaginal ultrasound examinations of the pelvis were performed. Transabdominal technique was performed for global imaging of the pelvis including uterus, ovaries, adnexal regions, and pelvic cul-de-sac. It was necessary to proceed with endovaginal exam following the transabdominal exam to visualize the ovaries.  COMPARISON: None  FINDINGS: Uterus  Measurements: 7 x 2.4 x 4.3 cm. No fibroids or other mass visualized.  Endometrium  Thickness: 4.2 mm. No focal abnormality visualized.  Right ovary  Measurements: 3.9 x 2.0 x 1.8 cm. Normal appearance/no adnexal mass.  Left ovary  Measurements: 3.3 x 2.8 x 2.6 cm. Normal appearance/no adnexal mass.  Other findings  Small amount of free fluid.  IMPRESSION: 1. Essentially normal pelvic sonogram. 2. Free fluid within the pelvis may be physiologic in a premenopausal female.   Electronically Signed  By: Signa Kell M.D.  On: 01/04/2014 19:19  Assessment   Pelvic pain continues since her last DMPA injection  Possible endometriosis  Plan    Needs dx laparoscopy, the procedure and risk including no diagnosis, the risk of anesthesia, bleeding, infection, bowel and bladder injury,were discussed and her questions were answered. The procedure will be scheduled as an outpatient.    Adam PhenixJames G Malakai Schoenherr, MD 06/28/2014 7:01 AM

## 2014-06-28 ENCOUNTER — Ambulatory Visit (HOSPITAL_COMMUNITY): Payer: 59 | Admitting: Anesthesiology

## 2014-06-28 ENCOUNTER — Encounter (HOSPITAL_COMMUNITY): Payer: Self-pay | Admitting: *Deleted

## 2014-06-28 ENCOUNTER — Encounter (HOSPITAL_COMMUNITY)
Admission: AD | Disposition: A | Discharge status: Home / Self Care | Payer: Self-pay | Source: Ambulatory Visit | Attending: Obstetrics & Gynecology

## 2014-06-28 ENCOUNTER — Ambulatory Visit (HOSPITAL_COMMUNITY)
Admission: AD | Admit: 2014-06-28 | Discharge: 2014-06-28 | Disposition: A | Discharge status: Home / Self Care | Payer: 59 | Source: Ambulatory Visit | Attending: Obstetrics & Gynecology | Admitting: Obstetrics & Gynecology

## 2014-06-28 DIAGNOSIS — Z79899 Other long term (current) drug therapy: Secondary | ICD-10-CM

## 2014-06-28 DIAGNOSIS — N736 Female pelvic peritoneal adhesions (postinfective): Secondary | ICD-10-CM | POA: Insufficient documentation

## 2014-06-28 DIAGNOSIS — N949 Unspecified condition associated with female genital organs and menstrual cycle: Secondary | ICD-10-CM | POA: Diagnosis present

## 2014-06-28 DIAGNOSIS — N803 Endometriosis of pelvic peritoneum: Secondary | ICD-10-CM | POA: Insufficient documentation

## 2014-06-28 DIAGNOSIS — N809 Endometriosis, unspecified: Secondary | ICD-10-CM | POA: Diagnosis present

## 2014-06-28 HISTORY — PX: LAPAROSCOPY: SHX197

## 2014-06-28 LAB — CBC
HCT: 38 % (ref 36.0–46.0)
Hemoglobin: 12.7 g/dL (ref 12.0–15.0)
MCH: 29.5 pg (ref 26.0–34.0)
MCHC: 33.4 g/dL (ref 30.0–36.0)
MCV: 88.4 fL (ref 78.0–100.0)
Platelets: 202 10*3/uL (ref 150–400)
RBC: 4.3 MIL/uL (ref 3.87–5.11)
RDW: 12 % (ref 11.5–15.5)
WBC: 5.8 10*3/uL (ref 4.0–10.5)

## 2014-06-28 LAB — PREGNANCY, URINE: PREG TEST UR: NEGATIVE

## 2014-06-28 SURGERY — LAPAROSCOPY, DIAGNOSTIC
Anesthesia: General | Site: Abdomen

## 2014-06-28 MED ORDER — MIDAZOLAM HCL 2 MG/2ML IJ SOLN
INTRAMUSCULAR | Status: DC | PRN
  Administered 2014-06-28: 2 mg via INTRAVENOUS

## 2014-06-28 MED ORDER — ROCURONIUM BROMIDE 100 MG/10ML IV SOLN
INTRAVENOUS | Status: AC
  Filled 2014-06-28: qty 1

## 2014-06-28 MED ORDER — GLYCOPYRROLATE 0.2 MG/ML IJ SOLN
INTRAMUSCULAR | Status: AC
  Filled 2014-06-28: qty 3

## 2014-06-28 MED ORDER — BUPIVACAINE HCL (PF) 0.25 % IJ SOLN
INTRAMUSCULAR | Status: DC | PRN
  Administered 2014-06-28: 9 mL

## 2014-06-28 MED ORDER — MIDAZOLAM HCL 2 MG/2ML IJ SOLN
INTRAMUSCULAR | Status: AC
  Filled 2014-06-28: qty 2

## 2014-06-28 MED ORDER — DEXAMETHASONE SODIUM PHOSPHATE 4 MG/ML IJ SOLN
INTRAMUSCULAR | Status: AC
  Filled 2014-06-28: qty 1

## 2014-06-28 MED ORDER — FENTANYL CITRATE 0.05 MG/ML IJ SOLN: 25.0000 ug | INTRAMUSCULAR | Status: DC | PRN

## 2014-06-28 MED ORDER — ONDANSETRON HCL 4 MG/2ML IJ SOLN
INTRAMUSCULAR | Status: DC | PRN
  Administered 2014-06-28: 4 mg via INTRAVENOUS

## 2014-06-28 MED ORDER — LACTATED RINGERS IV SOLN: INTRAVENOUS | Status: DC

## 2014-06-28 MED ORDER — BUPIVACAINE HCL (PF) 0.25 % IJ SOLN
INTRAMUSCULAR | Status: AC
  Filled 2014-06-28: qty 30

## 2014-06-28 MED ORDER — NEOSTIGMINE METHYLSULFATE 10 MG/10ML IV SOLN
INTRAVENOUS | Status: AC
  Filled 2014-06-28: qty 1

## 2014-06-28 MED ORDER — DEXAMETHASONE SODIUM PHOSPHATE 10 MG/ML IJ SOLN
INTRAMUSCULAR | Status: DC | PRN
  Administered 2014-06-28: 4 mg via INTRAVENOUS

## 2014-06-28 MED ORDER — FENTANYL CITRATE 0.05 MG/ML IJ SOLN
INTRAMUSCULAR | Status: AC
  Filled 2014-06-28: qty 2

## 2014-06-28 MED ORDER — NEOSTIGMINE METHYLSULFATE 10 MG/10ML IV SOLN
INTRAVENOUS | Status: DC | PRN
  Administered 2014-06-28: 3 mg via INTRAVENOUS

## 2014-06-28 MED ORDER — OXYCODONE-ACETAMINOPHEN 5-325 MG PO TABS
ORAL_TABLET | ORAL | Status: AC
  Filled 2014-06-28: qty 1

## 2014-06-28 MED ORDER — FENTANYL CITRATE 0.05 MG/ML IJ SOLN
INTRAMUSCULAR | Status: DC | PRN
  Administered 2014-06-28 (×2): 50 ug via INTRAVENOUS
  Administered 2014-06-28: 100 ug via INTRAVENOUS
  Administered 2014-06-28: 50 ug via INTRAVENOUS
  Administered 2014-06-28: 100 ug via INTRAVENOUS

## 2014-06-28 MED ORDER — PROPOFOL 10 MG/ML IV BOLUS
INTRAVENOUS | Status: DC | PRN
  Administered 2014-06-28: 150 mg via INTRAVENOUS

## 2014-06-28 MED ORDER — GLYCOPYRROLATE 0.2 MG/ML IJ SOLN
INTRAMUSCULAR | Status: DC | PRN
  Administered 2014-06-28: 0.6 mg via INTRAVENOUS

## 2014-06-28 MED ORDER — LIDOCAINE HCL (CARDIAC) 20 MG/ML IV SOLN
INTRAVENOUS | Status: AC
  Filled 2014-06-28: qty 5

## 2014-06-28 MED ORDER — ROCURONIUM BROMIDE 100 MG/10ML IV SOLN
INTRAVENOUS | Status: DC | PRN
  Administered 2014-06-28: 40 mg via INTRAVENOUS

## 2014-06-28 MED ORDER — PROPOFOL 10 MG/ML IV BOLUS
INTRAVENOUS | Status: AC
  Filled 2014-06-28: qty 40

## 2014-06-28 MED ORDER — OXYCODONE-ACETAMINOPHEN 5-325 MG PO TABS: 1.0000 | ORAL_TABLET | Freq: Four times a day (QID) | ORAL | Status: DC | PRN

## 2014-06-28 MED ORDER — SCOPOLAMINE 1 MG/3DAYS TD PT72
MEDICATED_PATCH | TRANSDERMAL | Status: AC
  Filled 2014-06-28: qty 1

## 2014-06-28 MED ORDER — SCOPOLAMINE 1 MG/3DAYS TD PT72
1.0000 | MEDICATED_PATCH | Freq: Once | TRANSDERMAL | Status: DC
  Administered 2014-06-28: 1.5 mg via TRANSDERMAL

## 2014-06-28 MED ORDER — SODIUM CHLORIDE 0.9 % IJ SOLN
INTRAMUSCULAR | Status: DC | PRN
  Administered 2014-06-28: 10 mL via INTRAVENOUS

## 2014-06-28 MED ORDER — LACTATED RINGERS IV SOLN
INTRAVENOUS | Status: DC
Start: 2014-06-28 — End: 2014-06-28
  Administered 2014-06-28 (×2): via INTRAVENOUS

## 2014-06-28 MED ORDER — KETOROLAC TROMETHAMINE 30 MG/ML IJ SOLN
INTRAMUSCULAR | Status: AC
  Filled 2014-06-28: qty 1

## 2014-06-28 MED ORDER — OXYCODONE-ACETAMINOPHEN 5-325 MG PO TABS
1.0000 | ORAL_TABLET | Freq: Once | ORAL | Status: AC
Start: 2014-06-28 — End: 2014-06-28
  Administered 2014-06-28: 1 via ORAL

## 2014-06-28 MED ORDER — LIDOCAINE HCL (CARDIAC) 20 MG/ML IV SOLN
INTRAVENOUS | Status: DC | PRN
  Administered 2014-06-28: 50 mg via INTRAVENOUS

## 2014-06-28 MED ORDER — ONDANSETRON HCL 4 MG/2ML IJ SOLN
INTRAMUSCULAR | Status: AC
  Filled 2014-06-28: qty 2

## 2014-06-28 MED ORDER — KETOROLAC TROMETHAMINE 30 MG/ML IJ SOLN
INTRAMUSCULAR | Status: DC | PRN
  Administered 2014-06-28: 30 mg via INTRAVENOUS

## 2014-06-28 MED ORDER — FENTANYL CITRATE 0.05 MG/ML IJ SOLN
INTRAMUSCULAR | Status: AC
  Filled 2014-06-28: qty 5

## 2014-06-28 MED ORDER — ONDANSETRON HCL 4 MG/2ML IJ SOLN
4.0000 mg | Freq: Once | INTRAMUSCULAR | Status: DC | PRN
Start: 2014-06-28 — End: 2014-06-28

## 2014-06-28 SURGICAL SUPPLY — 28 items
CABLE HIGH FREQUENCY MONO STRZ (ELECTRODE) IMPLANT
CATH ROBINSON RED A/P 16FR (CATHETERS) IMPLANT
CHLORAPREP W/TINT 26ML (MISCELLANEOUS) ×3 IMPLANT
CLOSURE WOUND 1/2 X4 (GAUZE/BANDAGES/DRESSINGS)
CLOTH BEACON ORANGE TIMEOUT ST (SAFETY) ×3 IMPLANT
DRSG COVADERM PLUS 2X2 (GAUZE/BANDAGES/DRESSINGS) ×3 IMPLANT
DRSG OPSITE POSTOP 3X4 (GAUZE/BANDAGES/DRESSINGS) IMPLANT
GLOVE BIO SURGEON STRL SZ 6.5 (GLOVE) ×2 IMPLANT
GLOVE BIO SURGEONS STRL SZ 6.5 (GLOVE) ×1
GLOVE BIOGEL PI IND STRL 7.0 (GLOVE) ×1 IMPLANT
GLOVE BIOGEL PI INDICATOR 7.0 (GLOVE) ×2
GOWN STRL REUS W/TWL LRG LVL3 (GOWN DISPOSABLE) ×6 IMPLANT
LIQUID BAND (GAUZE/BANDAGES/DRESSINGS) IMPLANT
NEEDLE INSUFFLATION 120MM (ENDOMECHANICALS) ×3 IMPLANT
NS IRRIG 1000ML POUR BTL (IV SOLUTION) ×3 IMPLANT
PACK LAPAROSCOPY BASIN (CUSTOM PROCEDURE TRAY) ×3 IMPLANT
PAD POSITIONER PINK NONSTERILE (MISCELLANEOUS) ×3 IMPLANT
PROTECTOR NERVE ULNAR (MISCELLANEOUS) ×3 IMPLANT
SET IRRIG TUBING LAPAROSCOPIC (IRRIGATION / IRRIGATOR) IMPLANT
SHEARS HARMONIC ACE PLUS 36CM (ENDOMECHANICALS) ×3 IMPLANT
STRIP CLOSURE SKIN 1/2X4 (GAUZE/BANDAGES/DRESSINGS) IMPLANT
SUT VICRYL 0 UR6 27IN ABS (SUTURE) ×3 IMPLANT
SUT VICRYL 4-0 PS2 18IN ABS (SUTURE) ×3 IMPLANT
TOWEL OR 17X24 6PK STRL BLUE (TOWEL DISPOSABLE) ×6 IMPLANT
TRAY FOLEY BAG SILVER LF 16FR (CATHETERS) ×3 IMPLANT
TROCAR XCEL DIL TIP R 11M (ENDOMECHANICALS) ×3 IMPLANT
TROCAR XCEL NON-BLD 5MMX100MML (ENDOMECHANICALS) ×9 IMPLANT
WARMER LAPAROSCOPE (MISCELLANEOUS) ×3 IMPLANT

## 2014-06-28 NOTE — Anesthesia Procedure Notes (Signed)
Procedure Name: Intubation Date/Time: 06/28/2014 7:27 AM Performed by: Donnalee CurryMALINOVA, Gabrial Domine HRISTOVA Pre-anesthesia Checklist: Suction available, Patient identified, Emergency Drugs available, Patient being monitored and Timeout performed Patient Re-evaluated:Patient Re-evaluated prior to inductionOxygen Delivery Method: Circle system utilized Preoxygenation: Pre-oxygenation with 100% oxygen Intubation Type: IV induction Ventilation: Mask ventilation without difficulty Laryngoscope Size: Mac and 3 Grade View: Grade I Tube type: Oral Tube size: 7.0 mm Number of attempts: 1 Airway Equipment and Method: Stylet Placement Confirmation: ETT inserted through vocal cords under direct vision,  positive ETCO2 and breath sounds checked- equal and bilateral Secured at: 21 cm Tube secured with: Tape Dental Injury: Teeth and Oropharynx as per pre-operative assessment

## 2014-06-28 NOTE — Transfer of Care (Signed)
Immediate Anesthesia Transfer of Care Note  Patient: Sarah Chen  Procedure(s) Performed: Procedure(s) with comments: LAPAROSCOPY DIAGNOSTIC, LYSIS OF ADHESIONS, CAUTERIZATION ENDOMETREOSIS  (N/A) - vaginal  Patient Location: PACU  Anesthesia Type:General  Level of Consciousness: awake  Airway & Oxygen Therapy: Patient Spontanous Breathing and Patient connected to nasal cannula oxygen  Post-op Assessment: Report given to RN and Post -op Vital signs reviewed and stable  Post vital signs: Reviewed and stable  Last Vitals:  Filed Vitals:   06/28/14 0603  BP: 105/83  Pulse: 100  Temp: 36.7 C  Resp: 16    Complications: No apparent anesthesia complications

## 2014-06-28 NOTE — Discharge Instructions (Signed)
Endometriosis Endometriosis is a condition in which the tissue that lines the uterus (endometrium) grows outside of its normal location. The tissue may grow in many locations close to the uterus, but it commonly grows on the ovaries, fallopian tubes, vagina, or bowel. Because the uterus expels, or sheds, its lining every menstrual cycle, there is bleeding wherever the endometrial tissue is located. This can cause pain because blood is irritating to tissues not normally exposed to it.  CAUSES  The cause of endometriosis is not known.  SIGNS AND SYMPTOMS  Often, there are no symptoms. When symptoms are present, they can vary with the location of the displaced tissue. Various symptoms can occur at different times. Although symptoms occur mainly during a woman's menstrual period, they can also occur midcycle and usually stop with menopause. Some people may go months with no symptoms at all. Symptoms may include:   Back or abdominal pain.   Heavier bleeding during periods.   Pain during intercourse.   Painful bowel movements.   Infertility. DIAGNOSIS  Your health care provider will do a physical exam and ask about your symptoms. Various tests may be done, such as:   Blood tests and urine tests. These are done to help rule out other problems.   Ultrasound. This test is done to look for abnormal tissue.   An X-ray of the lower bowel (barium enema).  Laparoscopy. In this procedure, a thin, lighted tube with a tiny camera on the end (laparoscope) is inserted into your abdomen. This helps your health care provider look for abnormal tissue to confirm the diagnosis. The health care provider may also remove a small piece of tissue (biopsy) from any abnormal tissue found. This tissue sample can then be sent to a lab so it can be looked at under a microscope. TREATMENT  Treatment will vary and may include:   Medicines to relieve pain. Nonsteroidal anti-inflammatory drugs (NSAIDs) are a type of  pain medicine that can help to relieve the pain caused by endometriosis.  Hormonal therapy. When using hormonal therapy, periods are eliminated. This eliminates the monthly exposure to blood by the displaced endometrial tissue.   Surgery. Surgery may sometimes be done to remove the abnormal endometrial tissue. In severe cases, surgery may be done to remove the fallopian tubes, uterus, and ovaries (hysterectomy). HOME CARE INSTRUCTIONS   Take all medicines as directed by your health care provider. Do not take aspirin because it may increase bleeding when you are not on hormonal therapy.   Avoid activities that produce pain, including sexual activity. SEEK MEDICAL CARE IF:  You have pelvic pain before, after, or during your periods.  You have pelvic pain between periods that gets worse during your period.  You have pelvic pain during or after sex.  You have pelvic pain with bowel movements or urination, especially during your period.  You have problems getting pregnant.  You have a fever. SEEK IMMEDIATE MEDICAL CARE IF:   Your pain is severe and is not responding to pain medicine.   You have severe nausea and vomiting, or you cannot keep foods down.   You have pain that is limited to the right lower part of your abdomen.   You have swelling or increasing pain in your abdomen.   You see blood in your stool.  MAKE SURE YOU:   Understand these instructions.  Will watch your condition.  Will get help right away if you are not doing well or get worse. Document Released: 04/19/2000 Document   Revised: 09/06/2013 Document Reviewed: 12/18/2012 [facility] Patient Information 2015 [place], [place]. This information is not intended to replace advice given to you by your health care provider. Make sure you discuss any questions you have with your health care provider.   DISCHARGE INSTRUCTIONS: Laparoscopy  No Ibuprofen containing products (ie. Advil, Aleve, Motrin, etc.) until  after 2:30 pm today.  Wound care:  Do not get the incision wet for the first 24 hours. The incision should be kept clean and dry.  The top dressing at your umbilicus can be removed in 2 days, the lower dressings on each side of your abdomen  may be removed the day after surgery.  Should the incision become sore, red, and swollen after the first week, check with your doctor.  Personal hygiene:  Shower the day after your procedure.  Activity and limitations:  Do NOT drive or operate any equipment today.  Do NOT lift anything more than 15 pounds for 2-3 weeks after surgery.  Do NOT rest in bed all day.  Walking is encouraged. Walk each day, starting slowly with 5-minute walks 3 or 4 times a day. Slowly increase the length of your walks.  Walk up and down stairs slowly.  Do NOT do strenuous activities, such as golfing, playing tennis, bowling, running, biking, weight lifting, gardening, mowing, or vacuuming for 2-4 weeks. Ask your doctor when it is okay to start.  Diet: Eat a light meal as desired this evening. You may resume your usual diet tomorrow.  Return to work: This is dependent on the type of work you do. For the most part you can return to a desk job within a week of surgery. If you are more active at work, please discuss this with your doctor.  What to expect after your surgery: You may have a slight burning sensation when you urinate on the first day. You may have a very small amount of blood in the urine. Expect to have a small amount of vaginal discharge/light bleeding for 1-2 weeks. It is not unusual to have abdominal soreness and bruising for up to 2 weeks. You may be tired and need more rest for about 1 week. You may experience shoulder pain for 24-72 hours. Lying flat in bed may relieve it.  Call your doctor for any of the following:  Develop a fever of 100.4 or greater  Inability to urinate 6 hours after discharge from hospital  Severe pain not relieved by pain  medications  Persistent of heavy bleeding at incision site  Redness or swelling around incision site after a week  Increasing nausea or vomiting  Patient Signature________________________________________ Nurse Signature_________________________________________

## 2014-06-28 NOTE — Anesthesia Postprocedure Evaluation (Signed)
  Anesthesia Post-op Note  Patient: Sarah Chen  Procedure(s) Performed: Procedure(s) (LRB): LAPAROSCOPY DIAGNOSTIC, LYSIS OF ADHESIONS, CAUTERIZATION ENDOMETREOSIS  (N/A)  Patient Location: PACU  Anesthesia Type: General  Level of Consciousness: awake and alert   Airway and Oxygen Therapy: Patient Spontanous Breathing  Post-op Pain: mild  Post-op Assessment: Post-op Vital signs reviewed, Patient's Cardiovascular Status Stable, Respiratory Function Stable, Patent Airway and No signs of Nausea or vomiting  Last Vitals:  Filed Vitals:   06/28/14 0900  BP: 112/59  Pulse: 79  Temp:   Resp: 18    Post-op Vital Signs: stable   Complications: No apparent anesthesia complications

## 2014-06-28 NOTE — Op Note (Signed)
Diagnostic Laparoscopy Procedure Note  Indications: The patient is a 24 y.o. female with pelvic pain for laparoscopy.  Pre-operative Diagnosis: Pelvic pain  Post-operative Diagnosis: endometriosis and pelvic adhesions  Surgeon: Shea Swalley   Assistants: none  Anesthesia: General endotracheal anesthesia  ASA Class: 1  Procedure Details  The patient was seen in the Holding Room. The risks, benefits, complications, treatment options, and expected outcomes were discussed with the patient. The possibilities of reaction to medication, pulmonary aspiration, perforation of viscus, bleeding, recurrent infection, the need for additional procedures, failure to diagnose a condition, and creating a complication requiring transfusion or operation were discussed with the patient. The patient concurred with the proposed plan, giving informed consent. The patient was taken to the Operating Room, identified as Sarah CiproChelsea Chen and the procedure verified as Diagnostic Laparoscopy. A Time Out was held and the above information confirmed.  After induction of general anesthesia, the patient was placed in modified dorsal lithotomy position where she was prepped, draped, and catheterized in the normal, sterile fashion.  The cervix was visualized and an intrauterine manipulator was placed. A 1 cm umbilical incision was then performed. Veress needle was passed and pneumoperitoneum was established. A 5 mm trocar was placed and the 5 mm laparoscope was inserted with video camera and use. Patient's placed in Trendelenburg position. The Hulka tenaculum was used to manipulate the uterus. Uterus appeared normal. Tubes appeared normal. Ovaries were normal.: Cul-de-sac was visualized and small implants of endometriosis were seen. 5 mm trochars were placed in the left lower quadrant and right lower quadrant. The cecum and ascending colon were noted to have adhesions to the pelvic sidewalls. These were lysed using the Harmonic  scalpel. The appendix appeared normal. Some less extensive adhesions were seen on the left side involving the sigmoid colon. These were likewise lysed with the Harmonic scalpel. Endometriosis implants that were seen in the pelvis were cauterized. The ureters were seen on both sides. Pneumoperitoneum was released and instruments were removed. Skin incisions were closed with interrupted sutures of 4-0 Vicryl and sterile dressings were applied. Patient tolerated procedure without, occasional.   The intrauterine manipulator was then removed.  Instrument, sponge, and needle counts were correct prior to abdominal closure and at the conclusion of the case.    Estimated Blood Loss:  Minimal         Drains: none         Total IV Fluids: 1000 mL         Specimens: none              Complications:  None; patient tolerated the procedure well.         Disposition: PACU - hemodynamically stable.         Condition: stable   [name], MD 06/28/2014 8:39 AM

## 2014-06-29 ENCOUNTER — Encounter (HOSPITAL_COMMUNITY): Payer: Self-pay | Admitting: Obstetrics & Gynecology

## 2014-07-05 ENCOUNTER — Ambulatory Visit: Payer: Self-pay | Admitting: Family

## 2014-07-13 ENCOUNTER — Ambulatory Visit: Payer: 59 | Admitting: Obstetrics & Gynecology

## 2014-07-18 ENCOUNTER — Telehealth: Payer: Self-pay

## 2014-07-18 NOTE — Telephone Encounter (Signed)
Patient called stating she had surgery by Dr. Debroah LoopArnold on 2/23 and has been up vomiting since this AM and now feels some "pressure down there"-- wants to know if she needs to be seen. Discussed with Dr. Debroah LoopArnold-- states 3 weeks post-op, likely patient just has a virus and vomiting may cause intra-abdominal/pelvic pressure-- not concerned at this time. Attempted to contact patient-- no answer. Left message stating we are returning your call, please call clinic.

## 2014-07-19 NOTE — Telephone Encounter (Signed)
Attempted to contact patient again regarding call from yesterday. No answer. Left message stating we are returning your call from yesterday, if you still have questions or concerns please call clinic.

## 2014-08-05 ENCOUNTER — Ambulatory Visit (INDEPENDENT_AMBULATORY_CARE_PROVIDER_SITE_OTHER): Payer: 59 | Admitting: Obstetrics & Gynecology

## 2014-08-05 ENCOUNTER — Encounter: Payer: Self-pay | Admitting: Obstetrics & Gynecology

## 2014-08-05 VITALS — BP 123/85 | HR 93 | Temp 97.6°F | Wt 119.4 lb

## 2014-08-05 DIAGNOSIS — N809 Endometriosis, unspecified: Secondary | ICD-10-CM

## 2014-08-05 DIAGNOSIS — R102 Pelvic and perineal pain: Secondary | ICD-10-CM

## 2014-08-05 DIAGNOSIS — Z9889 Other specified postprocedural states: Secondary | ICD-10-CM

## 2014-08-05 MED ORDER — MEDROXYPROGESTERONE ACETATE 10 MG PO TABS: 20.0000 mg | ORAL_TABLET | Freq: Every day | ORAL | Status: DC

## 2014-08-05 MED ORDER — AMITRIPTYLINE HCL 10 MG PO TABS: 10.0000 mg | ORAL_TABLET | Freq: Every day | ORAL | Status: DC

## 2014-08-05 MED ORDER — LEUPROLIDE ACETATE (3 MONTH) 11.25 MG IM KIT
11.2500 mg | PACK | Freq: Once | INTRAMUSCULAR | Status: AC
  Administered 2014-08-05: 11.25 mg via INTRAMUSCULAR

## 2014-08-05 NOTE — Progress Notes (Signed)
Subjective:some pain and spotting     Sarah Chen is a 24 y.o. female who presents to the clinic 4 weeks status post lysis of adhesions and laparoscopy for pelvic pain. Eating a regular diet without difficulty. Bowel movements are normal. Pain is controlled with current analgesics. Medications being used: acetaminophen.  The following portions of the patient's history were reviewed and updated as appropriate: allergies, current medications, past family history, past medical history, past social history, past surgical history and problem list.  Review of Systems Pertinent items are noted in HPI.    Objective:    BP 123/85 mmHg  Pulse 93  Temp(Src) 97.6 F (36.4 C) (Oral)  Wt 119 lb 6.4 oz (54.159 kg) General:  alert, cooperative and no distress  Abdomen: soft, bowel sounds active, non-tender  Incision:   healing well, no drainage, no erythema, no hernia, no seroma, no swelling, no dehiscence, incision well approximated     Assessment:    Doing well postoperatively. endometriosis Operative findings again reviewed. Pathology report discussed.    Plan:    1. Continue any current medications. 2. Wound care discussed. 3. Activity restrictions: none 4. Anticipated return to work: not applicable. 5. Follow up: 3 months will receive lupron depot 11.25 and start provera 20 mg daily and continue for 6 months  Adam PhenixJames G Amelya Mabry, MD 08/05/2014

## 2014-08-05 NOTE — Patient Instructions (Signed)
Endometriosis Endometriosis is a condition in which the tissue that lines the uterus (endometrium) grows outside of its normal location. The tissue may grow in many locations close to the uterus, but it commonly grows on the ovaries, fallopian tubes, vagina, or bowel. Because the uterus expels, or sheds, its lining every menstrual cycle, there is bleeding wherever the endometrial tissue is located. This can cause pain because blood is irritating to tissues not normally exposed to it.  CAUSES  The cause of endometriosis is not known.  SIGNS AND SYMPTOMS  Often, there are no symptoms. When symptoms are present, they can vary with the location of the displaced tissue. Various symptoms can occur at different times. Although symptoms occur mainly during a woman's menstrual period, they can also occur midcycle and usually stop with menopause. Some people may go months with no symptoms at all. Symptoms may include:   Back or abdominal pain.   Heavier bleeding during periods.   Pain during intercourse.   Painful bowel movements.   Infertility. DIAGNOSIS  Your health care provider will do a physical exam and ask about your symptoms. Various tests may be done, such as:   Blood tests and urine tests. These are done to help rule out other problems.   Ultrasound. This test is done to look for abnormal tissue.   An X-ray of the lower bowel (barium enema).  Laparoscopy. In this procedure, a thin, lighted tube with a tiny camera on the end (laparoscope) is inserted into your abdomen. This helps your health care provider look for abnormal tissue to confirm the diagnosis. The health care provider may also remove a small piece of tissue (biopsy) from any abnormal tissue found. This tissue sample can then be sent to a lab so it can be looked at under a microscope. TREATMENT  Treatment will vary and may include:   Medicines to relieve pain. Nonsteroidal anti-inflammatory drugs (NSAIDs) are a type of  pain medicine that can help to relieve the pain caused by endometriosis.  Hormonal therapy. When using hormonal therapy, periods are eliminated. This eliminates the monthly exposure to blood by the displaced endometrial tissue.   Surgery. Surgery may sometimes be done to remove the abnormal endometrial tissue. In severe cases, surgery may be done to remove the fallopian tubes, uterus, and ovaries (hysterectomy). HOME CARE INSTRUCTIONS   Take all medicines as directed by your health care provider. Do not take aspirin because it may increase bleeding when you are not on hormonal therapy.   Avoid activities that produce pain, including sexual activity. SEEK MEDICAL CARE IF:  You have pelvic pain before, after, or during your periods.  You have pelvic pain between periods that gets worse during your period.  You have pelvic pain during or after sex.  You have pelvic pain with bowel movements or urination, especially during your period.  You have problems getting pregnant.  You have a fever. SEEK IMMEDIATE MEDICAL CARE IF:   Your pain is severe and is not responding to pain medicine.   You have severe nausea and vomiting, or you cannot keep foods down.   You have pain that is limited to the right lower part of your abdomen.   You have swelling or increasing pain in your abdomen.   You see blood in your stool.  MAKE SURE YOU:   Understand these instructions.  Will watch your condition.  Will get help right away if you are not doing well or get worse. Document Released: 04/19/2000 Document   Revised: 09/06/2013 Document Reviewed: 12/18/2012 [employer] Patient Information 2015 [employer]. This information is not intended to replace advice given to you by your health care provider. Make sure you discuss any questions you have with your health care provider.  

## 2014-08-29 ENCOUNTER — Ambulatory Visit: Payer: 59

## 2014-09-04 IMAGING — CR DG ANKLE COMPLETE 3+V*R*
3 series · 3 of 3 positions shown · non-contrast
Comparison: 11/05/2011.

CLINICAL DATA: History of ankle pain.

RIGHT ANKLE - COMPLETE 3+ VIEW

[AP]
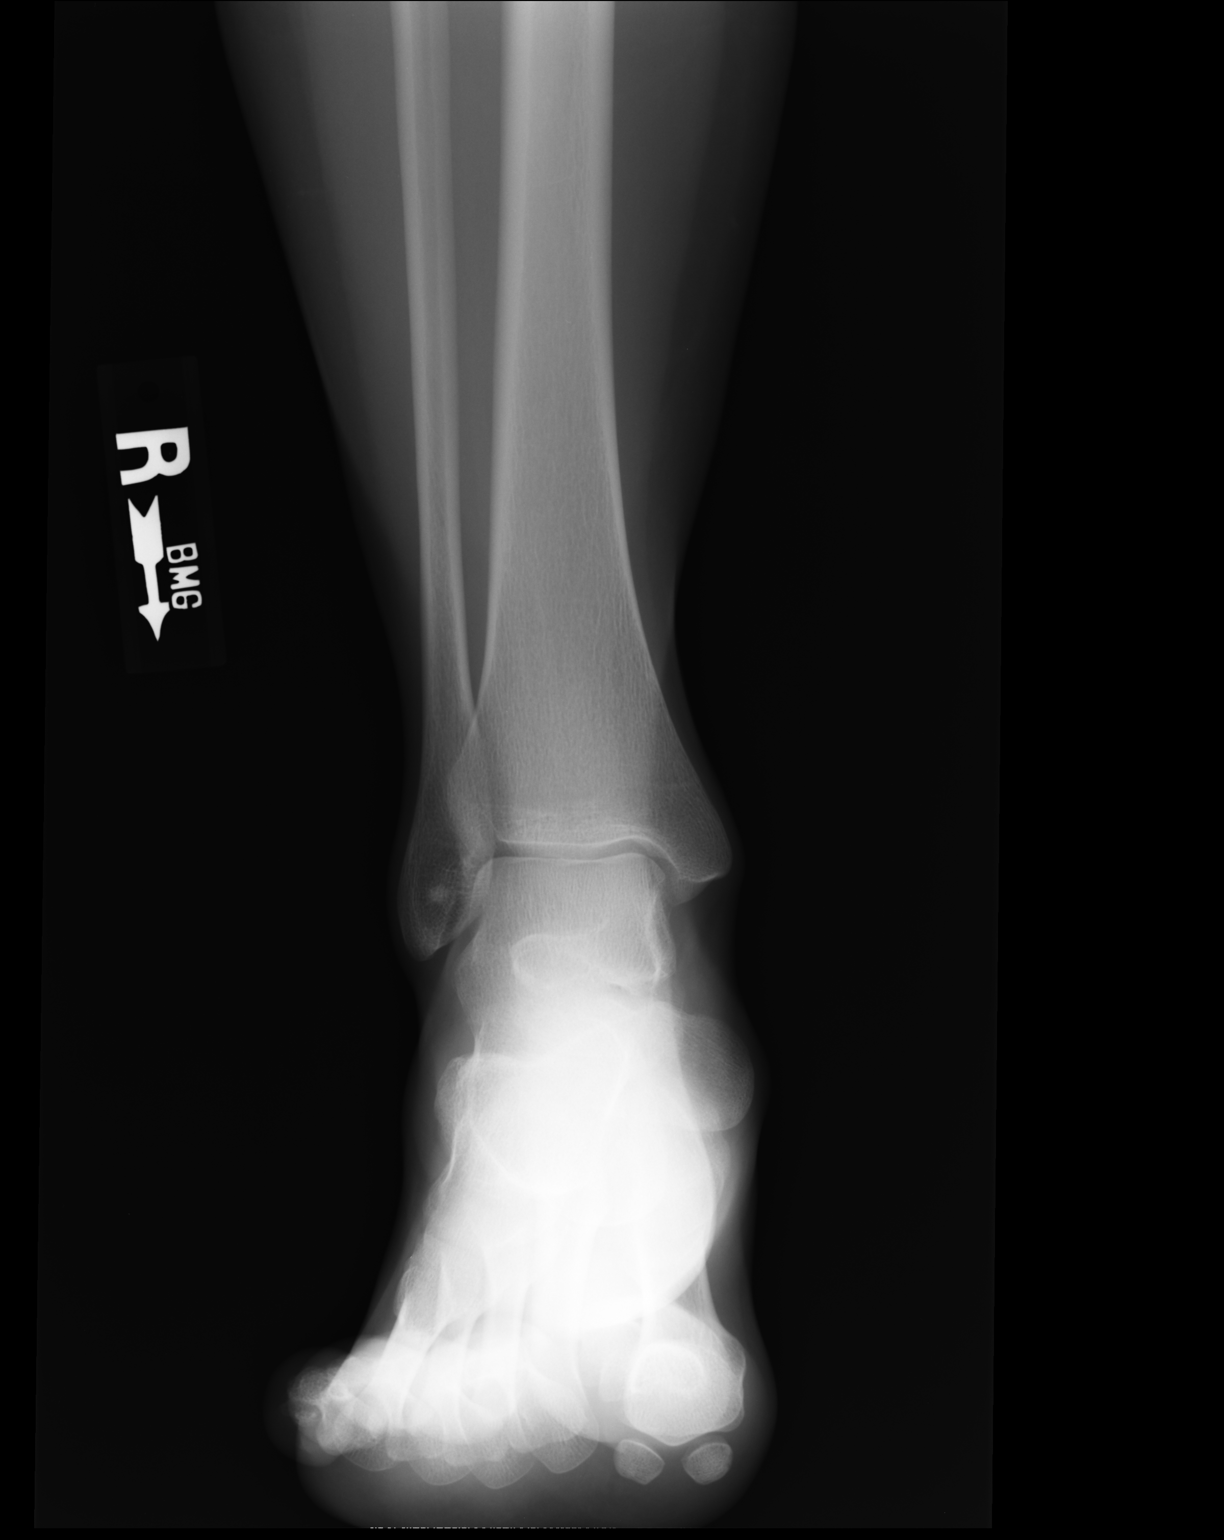

[ap obl int rot]
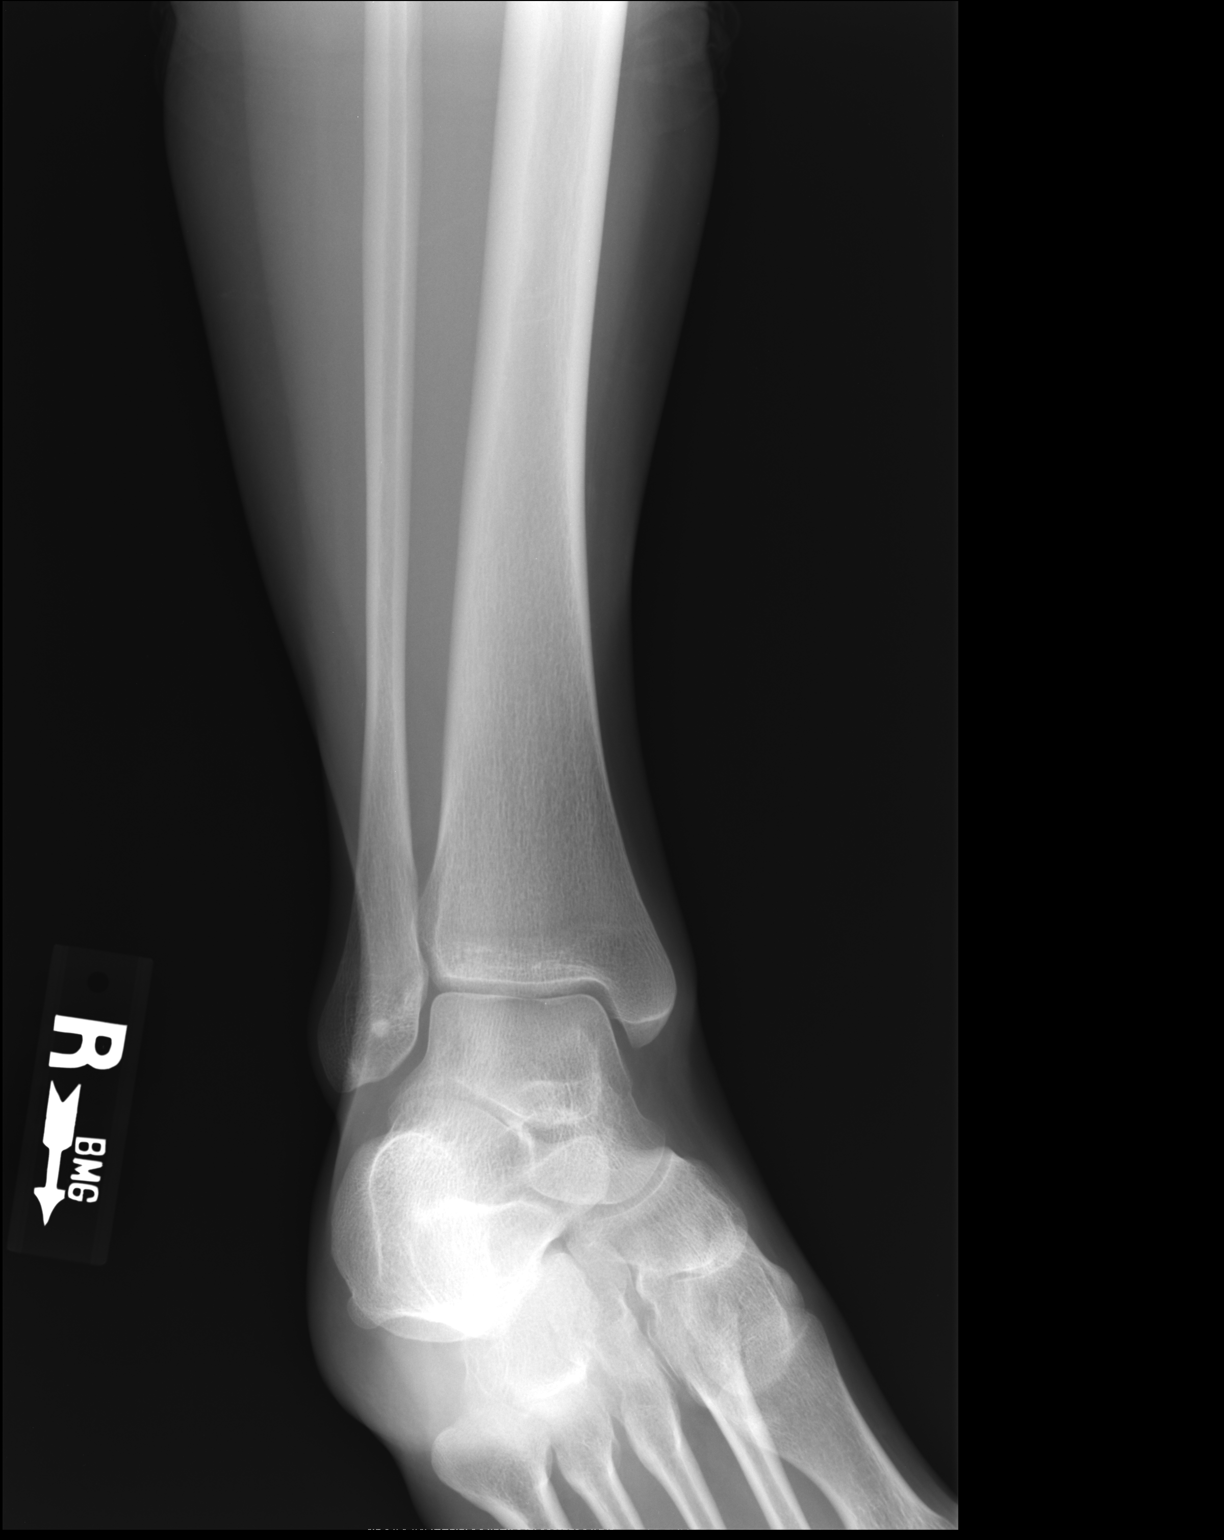

[lateral]
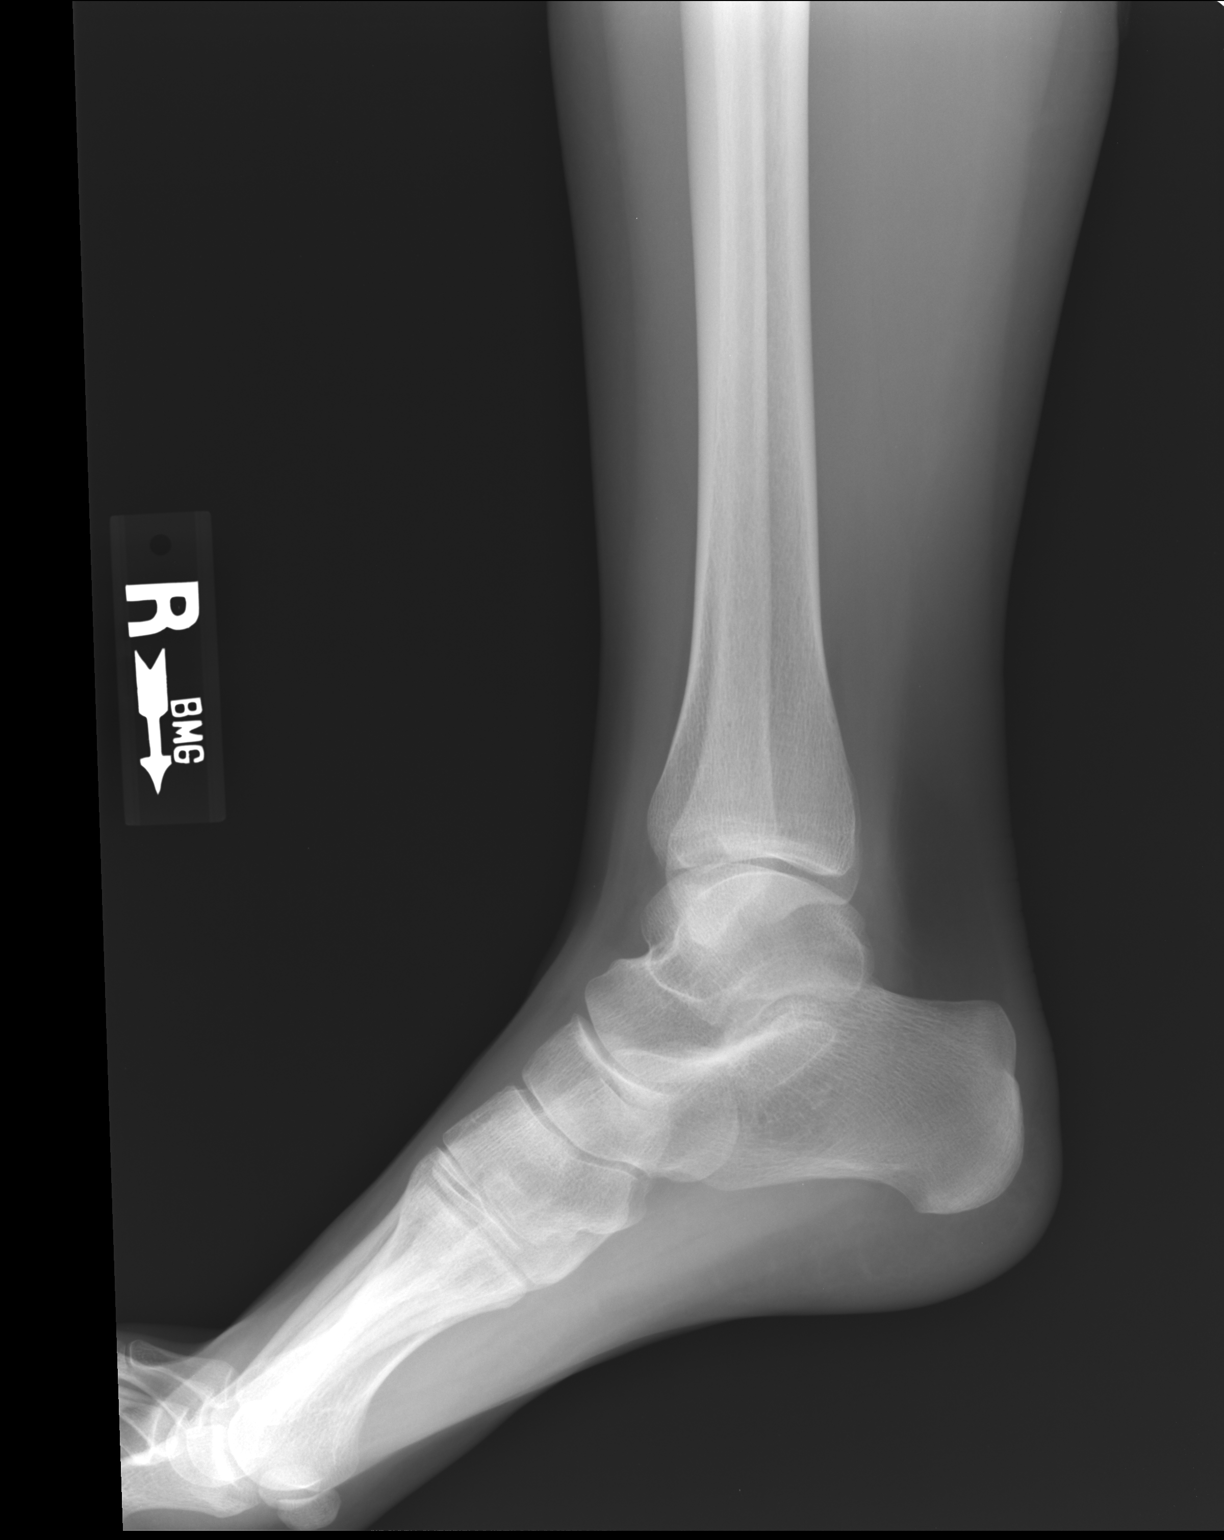

[3 of 3 positions shown; findings below may reference images not displayed]

FINDINGS: Alignment is normal.  Joint spaces are preserved.  No
fracture or dislocation is evident.  No soft tissue lesions are
seen.  Sclerotic density in distal fibula is unchanged consistent
with benign bone island.
IMPRESSION: No acute or active abnormality is identified.

## 2014-09-21 ENCOUNTER — Telehealth: Payer: Self-pay | Admitting: *Deleted

## 2014-09-21 NOTE — Telephone Encounter (Signed)
Ludivina called and left a message she is a patient of Dr. Debroah LoopArnold and currently on hormone theraphy but has been having quite a lot of pain. Requests a call

## 2014-09-22 NOTE — Telephone Encounter (Signed)
Called patient, no answer- left message stating we are trying to reach you to return your phone call, please call us back at the clinics 

## 2014-09-26 NOTE — Telephone Encounter (Signed)
Called Shamon back and she states her pelvic pain got better after surgery for  A while then has started back, but not as severe. States it is maybe a 5. States after she takes elavil at night pain is less.  States she thought that after surgery that would take care of it; also she has been trying to be healthier - working out,etc.States she had been taking the provera. Wants to know if she is supposed to be having pain- does she just need to deal with it?  Informed her I would discuss with Dr. Debroah LoopArnold and get back to her.

## 2014-09-26 NOTE — Telephone Encounter (Signed)
Discussed with Dr.Arnold and called Rozanne. Informed her her pain may wax and wan and that is normal, may take iburprofen.  Discussed want to complete plan of care which is currently to receive lupron x2 after surgery and then see him for follow up 2-3 months after 2nd shot. Will receive 2nd shot in June- explained she will need to call for appointment 1-2 months ahead. She voices understanding. Also asked if she needs to be taking birth control- I informed her no, the Lupron if effective for 3months.

## 2014-10-24 ENCOUNTER — Ambulatory Visit (INDEPENDENT_AMBULATORY_CARE_PROVIDER_SITE_OTHER): Payer: 59 | Admitting: *Deleted

## 2014-10-24 DIAGNOSIS — N809 Endometriosis, unspecified: Secondary | ICD-10-CM | POA: Diagnosis not present

## 2014-10-24 DIAGNOSIS — N946 Dysmenorrhea, unspecified: Secondary | ICD-10-CM

## 2014-10-24 MED ORDER — LEUPROLIDE ACETATE (3 MONTH) 11.25 MG IM KIT
11.2500 mg | PACK | Freq: Once | INTRAMUSCULAR | Status: AC
  Administered 2014-10-24: 11.25 mg via INTRAMUSCULAR

## 2014-10-31 ENCOUNTER — Other Ambulatory Visit: Payer: Self-pay | Admitting: Obstetrics & Gynecology

## 2014-12-12 ENCOUNTER — Other Ambulatory Visit: Payer: Self-pay | Admitting: Obstetrics & Gynecology

## 2014-12-29 ENCOUNTER — Telehealth: Payer: Self-pay | Admitting: *Deleted

## 2014-12-29 NOTE — Telephone Encounter (Signed)
Called pt and pt asked if she should still be having pain since she has had the surgery.  I confirmed with the patient that she was diagnosed with endometriosis.  She informed me that she was dx.  I informed pt that endometrosis is usually dealing with chronic pain.  I asked pt if she was having pain with her period.  She stated "yes, I did have pain with period that was about 2 weeks".  She also asked if it was normal to still have period symptoms even if she is not having a period.  I explained to the pt that it is not uncommon that even if she does have a period that she still has period symptoms.

## 2014-12-29 NOTE — Telephone Encounter (Signed)
Pt contacted the clinic and has a question concerning pain.

## 2015-01-20 ENCOUNTER — Ambulatory Visit: Payer: 59 | Admitting: Obstetrics & Gynecology

## 2015-01-23 DIAGNOSIS — F5101 Primary insomnia: Secondary | ICD-10-CM | POA: Insufficient documentation

## 2015-01-31 ENCOUNTER — Other Ambulatory Visit: Payer: Self-pay | Admitting: Obstetrics & Gynecology

## 2015-02-09 ENCOUNTER — Encounter: Payer: Self-pay | Admitting: Obstetrics & Gynecology

## 2015-02-09 ENCOUNTER — Ambulatory Visit (INDEPENDENT_AMBULATORY_CARE_PROVIDER_SITE_OTHER): Payer: 59 | Admitting: Obstetrics & Gynecology

## 2015-02-09 VITALS — BP 119/82 | HR 111 | Temp 98.4°F | Ht 66.0 in | Wt 130.7 lb

## 2015-02-09 DIAGNOSIS — N809 Endometriosis, unspecified: Secondary | ICD-10-CM

## 2015-02-09 MED ORDER — LEVONORGEST-ETH ESTRAD 91-DAY 0.15-0.03 &0.01 MG PO TABS: 1.0000 | ORAL_TABLET | Freq: Every day | ORAL | Status: DC

## 2015-02-09 NOTE — Progress Notes (Signed)
Patient ID: Sarah Chen, female   DOB: 1990-09-29, 24 y.o.   MRN: [id number]  Chief Complaint  Patient presents with  . Follow-up  h/o endometriosis still has occasional pain.  HPI Lyndsey Demos is a 24 y.o. female.  G0P0 No LMP recorded. Patient has had an injection. Last Lupron was in June. Never stopped having pain although much improved compared to prior to surgery. No bleeding  HPI  Past Medical History  Diagnosis Date  . Anxiety   . Knee pain   . Anxiety   . Anxiety     Past Surgical History  Procedure Laterality Date  . Knee arthroscopy    . Knee surgery    . Laparoscopy N/A 06/28/2014    Procedure: LAPAROSCOPY DIAGNOSTIC, LYSIS OF ADHESIONS, CAUTERIZATION ENDOMETREOSIS ;  Surgeon: Adam Phenix, MD;  Location: [facility];  Service: Gynecology;  Laterality: N/A;  vaginal    Family History  Problem Relation Age of Onset  . Arthritis Mother   . Mental illness Mother   . Mental illness Father   . Asthma Sister   . Mental illness Brother   . Diabetes Paternal Grandmother   . Heart disease Paternal Grandmother   . Cancer Paternal Grandfather     Social History Social History  Substance Use Topics  . Smoking status: Never Smoker   . Smokeless tobacco: Never Used  . Alcohol Use: Yes     Comment: occassionally    No Known Allergies  Current Outpatient Prescriptions  Medication Sig Dispense Refill  . amitriptyline (ELAVIL) 10 MG tablet TAKE 1 TABLET BY MOUTH AT BEDTIME. 30 tablet 2  . escitalopram (LEXAPRO) 10 MG tablet TAKE 1/2 TABLET DAILY FOR 7 DAYS THEN 1 TABLET BY MOUTH DAILY  2  . ibuprofen (ADVIL,MOTRIN) 800 MG tablet Take 800 mg by mouth every 8 (eight) hours as needed for mild pain or moderate pain.    . Multiple Vitamin (MULTIVITAMIN) capsule Take 2 capsules by mouth daily.     . Levonorgestrel-Ethinyl Estradiol (AMETHIA,CAMRESE) 0.15-0.03 &0.01 MG tablet Take 1 tablet by mouth daily. 1 Package 4  . medroxyPROGESTERone (PROVERA) 10 MG tablet TAKE 2  TABLETS BY MOUTH DAILY. (Patient not taking: Reported on 02/09/2015) 30 tablet 5   No current facility-administered medications for this visit.    Review of Systems Review of Systems  Constitutional: Negative.   Genitourinary: Positive for pelvic pain. Negative for dysuria, frequency, vaginal bleeding, vaginal discharge and dyspareunia.    Blood pressure 119/82, pulse 111, temperature 98.4 F (36.9 C), height  (1.676 m), weight 130 lb 11.2 oz (59.285 kg).  Physical Exam Physical Exam  Constitutional: She is oriented to person, place, and time. She appears well-developed. No distress.  Cardiovascular: Normal rate.   Pulmonary/Chest: Effort normal. No respiratory distress.  Neurological: She is alert and oriented to person, place, and time.  Skin: Skin is warm and dry.  Psychiatric: She has a normal mood and affect. Her behavior is normal.    Data Reviewed Office notes   Assessment    Patient Active Problem List   Diagnosis Date Noted  . Endometriosis determined by laparoscopy 06/28/2014  . Pelvic pain in female 03/17/2014  . Initiation of Depo Provera 03/17/2014   Still has some pain after treatment. Does not wish to conceive     Plan    Will try extended cycle OCP, Rx Seasonique given, report if sx worsen. F/U in 6 months    Continue Lexapro and consider increased dose.  ARNOLD,JAMES 02/09/2015, 4:14 PM

## 2015-02-09 NOTE — Patient Instructions (Signed)
Endometriosis Endometriosis is a condition in which the tissue that lines the uterus (endometrium) grows outside of its normal location. The tissue may grow in many locations close to the uterus, but it commonly grows on the ovaries, fallopian tubes, vagina, or bowel. Because the uterus expels, or sheds, its lining every menstrual cycle, there is bleeding wherever the endometrial tissue is located. This can cause pain because blood is irritating to tissues not normally exposed to it.  CAUSES  The cause of endometriosis is not known.  SIGNS AND SYMPTOMS  Often, there are no symptoms. When symptoms are present, they can vary with the location of the displaced tissue. Various symptoms can occur at different times. Although symptoms occur mainly during a woman's menstrual period, they can also occur midcycle and usually stop with menopause. Some people may go months with no symptoms at all. Symptoms may include:   Back or abdominal pain.   Heavier bleeding during periods.   Pain during intercourse.   Painful bowel movements.   Infertility. DIAGNOSIS  Your health care provider will do a physical exam and ask about your symptoms. Various tests may be done, such as:   Blood tests and urine tests. These are done to help rule out other problems.   Ultrasound. This test is done to look for abnormal tissue.   An X-ray of the lower bowel (barium enema).  Laparoscopy. In this procedure, a thin, lighted tube with a tiny camera on the end (laparoscope) is inserted into your abdomen. This helps your health care provider look for abnormal tissue to confirm the diagnosis. The health care provider may also remove a small piece of tissue (biopsy) from any abnormal tissue found. This tissue sample can then be sent to a lab so it can be looked at under a microscope. TREATMENT  Treatment will vary and may include:   Medicines to relieve pain. Nonsteroidal anti-inflammatory drugs (NSAIDs) are a type of  pain medicine that can help to relieve the pain caused by endometriosis.  Hormonal therapy. When using hormonal therapy, periods are eliminated. This eliminates the monthly exposure to blood by the displaced endometrial tissue.   Surgery. Surgery may sometimes be done to remove the abnormal endometrial tissue. In severe cases, surgery may be done to remove the fallopian tubes, uterus, and ovaries (hysterectomy). HOME CARE INSTRUCTIONS   Take all medicines as directed by your health care provider. Do not take aspirin because it may increase bleeding when you are not on hormonal therapy.   Avoid activities that produce pain, including sexual activity. SEEK MEDICAL CARE IF:  You have pelvic pain before, after, or during your periods.  You have pelvic pain between periods that gets worse during your period.  You have pelvic pain during or after sex.  You have pelvic pain with bowel movements or urination, especially during your period.  You have problems getting pregnant.  You have a fever. SEEK IMMEDIATE MEDICAL CARE IF:   Your pain is severe and is not responding to pain medicine.   You have severe nausea and vomiting, or you cannot keep foods down.   You have pain that is limited to the right lower part of your abdomen.   You have swelling or increasing pain in your abdomen.   You see blood in your stool.  MAKE SURE YOU:   Understand these instructions.  Will watch your condition.  Will get help right away if you are not doing well or get worse.   This information   is not intended to replace advice given to you by your health care provider. Make sure you discuss any questions you have with your health care provider.   Document Released: 04/19/2000 Document Revised: 05/13/2014 Document Reviewed: 12/18/2012 Elsevier Interactive Patient Education 2016 Elsevier Inc.  

## 2015-02-14 DIAGNOSIS — F329 Major depressive disorder, single episode, unspecified: Secondary | ICD-10-CM | POA: Insufficient documentation

## 2015-02-14 DIAGNOSIS — F419 Anxiety disorder, unspecified: Secondary | ICD-10-CM | POA: Insufficient documentation

## 2015-02-14 DIAGNOSIS — F32A Depression, unspecified: Secondary | ICD-10-CM | POA: Insufficient documentation

## 2015-02-20 ENCOUNTER — Telehealth: Payer: Self-pay | Admitting: *Deleted

## 2015-02-20 NOTE — Telephone Encounter (Signed)
Sarah Chen called and left a message today stating Dr. Debroah LoopArnold put her on a new birth control pill but pharmacy gave her more hormone pills. Wants to know if she needs to take the hormone pills or just the birth contol. States she is also concerned about her pain level- states it has been worse since she has been taking the birth control  Instead of the homone.   Discussed with Dr. Debroah LoopArnold. [name] instructed her per Dr. Debroah LoopArnold she should just be taking the birth control pills seasonique, not provera and that it may take a month or so for her body to adjust.( she is on week 2 of birth contol pills)  Also advised her he wants to see her in 6 months,but if her symptoms continue to worsen to call us back. She voices understanding.

## 2015-03-23 ENCOUNTER — Telehealth: Payer: Self-pay | Admitting: *Deleted

## 2015-03-23 NOTE — Telephone Encounter (Signed)
Pt left message stating that Dr. Debroah LoopArnold had put her on OCP's x6 months but her pain is getting worse.  She wants to know what to do.

## 2015-03-23 NOTE — Telephone Encounter (Signed)
Patient called back and stated that she missed a call from us. She stated that she is doing everything she is supposed to do but her pain is not getting better. She feels like she is bleeding but she isn't.

## 2015-03-23 NOTE — Telephone Encounter (Signed)
Called Sarah Chen back and left a message we are calling you back to discuss your complaints in more detail and asked if she would call back and leave a message as to how or if symtoms improved or changed over the 6 months and then we can discuss with Dr. Debroah LoopArnold.

## 2015-03-29 NOTE — Telephone Encounter (Signed)
Called and LM that if she continues to have any questions or concerns to please give us a call back.

## 2015-06-04 ENCOUNTER — Inpatient Hospital Stay (HOSPITAL_COMMUNITY)
Admission: AD | Admit: 2015-06-04 | Discharge: 2015-06-05 | Disposition: A | Discharge status: Home / Self Care | Payer: Commercial Managed Care - PPO | Source: Ambulatory Visit | Attending: Obstetrics & Gynecology | Admitting: Obstetrics & Gynecology

## 2015-06-04 ENCOUNTER — Encounter (HOSPITAL_COMMUNITY): Payer: Self-pay

## 2015-06-04 DIAGNOSIS — N809 Endometriosis, unspecified: Secondary | ICD-10-CM

## 2015-06-04 DIAGNOSIS — Z3202 Encounter for pregnancy test, result negative: Secondary | ICD-10-CM | POA: Diagnosis not present

## 2015-06-04 DIAGNOSIS — R109 Unspecified abdominal pain: Secondary | ICD-10-CM

## 2015-06-04 DIAGNOSIS — R1031 Right lower quadrant pain: Secondary | ICD-10-CM | POA: Diagnosis present

## 2015-06-04 DIAGNOSIS — R102 Pelvic and perineal pain: Secondary | ICD-10-CM | POA: Diagnosis not present

## 2015-06-04 LAB — POCT PREGNANCY, URINE: Preg Test, Ur: NEGATIVE

## 2015-06-04 LAB — URINALYSIS, ROUTINE W REFLEX MICROSCOPIC
Bilirubin Urine: NEGATIVE
Glucose, UA: NEGATIVE mg/dL
Hgb urine dipstick: NEGATIVE
KETONES UR: NEGATIVE mg/dL
LEUKOCYTES UA: NEGATIVE
NITRITE: NEGATIVE
Protein, ur: NEGATIVE mg/dL
Specific Gravity, Urine: 1.01 (ref 1.005–1.030)
pH: 5.5 (ref 5.0–8.0)

## 2015-06-04 NOTE — MAU Note (Signed)
Pt states that she has endometriosis and is having some stabbing pain in RLQ that started about 1.5 weeks ago. Dr Yetta Barre (from [facility]) gave her injection and prescription for but that has not helped. Denies vag bleeding or discharge.

## 2015-06-05 ENCOUNTER — Encounter (HOSPITAL_COMMUNITY): Payer: Self-pay | Admitting: Family

## 2015-06-05 ENCOUNTER — Inpatient Hospital Stay (HOSPITAL_COMMUNITY): Payer: Commercial Managed Care - PPO

## 2015-06-05 DIAGNOSIS — N809 Endometriosis, unspecified: Secondary | ICD-10-CM | POA: Diagnosis not present

## 2015-06-05 DIAGNOSIS — R102 Pelvic and perineal pain: Secondary | ICD-10-CM | POA: Diagnosis not present

## 2015-06-05 LAB — CBC
HCT: 36.6 % (ref 36.0–46.0)
Hemoglobin: 12.2 g/dL (ref 12.0–15.0)
MCH: 29.4 pg (ref 26.0–34.0)
MCHC: 33.3 g/dL (ref 30.0–36.0)
MCV: 88.2 fL (ref 78.0–100.0)
Platelets: 229 10*3/uL (ref 150–400)
RBC: 4.15 MIL/uL (ref 3.87–5.11)
RDW: 12.9 % (ref 11.5–15.5)
WBC: 7.1 10*3/uL (ref 4.0–10.5)

## 2015-06-05 MED ORDER — KETOROLAC TROMETHAMINE 60 MG/2ML IM SOLN
60.0000 mg | Freq: Once | INTRAMUSCULAR | Status: AC
  Administered 2015-06-05: 60 mg via INTRAMUSCULAR
  Filled 2015-06-05: qty 2

## 2015-06-05 MED ORDER — ACETAMINOPHEN-CODEINE 300-30 MG PO TABS
1.0000 | ORAL_TABLET | Freq: Four times a day (QID) | ORAL | Status: DC | PRN
Start: 2015-06-05 — End: 2015-07-06

## 2015-06-05 NOTE — Discharge Instructions (Signed)
Endometriosis Endometriosis is a condition in which the tissue that lines the uterus (endometrium) grows outside of its normal location. The tissue may grow in many locations close to the uterus, but it commonly grows on the ovaries, fallopian tubes, vagina, or bowel. Because the uterus expels, or sheds, its lining every menstrual cycle, there is bleeding wherever the endometrial tissue is located. This can cause pain because blood is irritating to tissues not normally exposed to it.  CAUSES  The cause of endometriosis is not known.  SIGNS AND SYMPTOMS  Often, there are no symptoms. When symptoms are present, they can vary with the location of the displaced tissue. Various symptoms can occur at different times. Although symptoms occur mainly during a woman's menstrual period, they can also occur midcycle and usually stop with menopause. Some people may go months with no symptoms at all. Symptoms may include:   Back or abdominal pain.   Heavier bleeding during periods.   Pain during intercourse.   Painful bowel movements.   Infertility. DIAGNOSIS  Your health care provider will do a physical exam and ask about your symptoms. Various tests may be done, such as:   Blood tests and urine tests. These are done to help rule out other problems.   Ultrasound. This test is done to look for abnormal tissue.   An X-ray of the lower bowel (barium enema).  Laparoscopy. In this procedure, a thin, lighted tube with a tiny camera on the end (laparoscope) is inserted into your abdomen. This helps your health care provider look for abnormal tissue to confirm the diagnosis. The health care provider may also remove a small piece of tissue (biopsy) from any abnormal tissue found. This tissue sample can then be sent to a lab so it can be looked at under a microscope. TREATMENT  Treatment will vary and may include:   Medicines to relieve pain. Nonsteroidal anti-inflammatory drugs (NSAIDs) are a type of  pain medicine that can help to relieve the pain caused by endometriosis.  Hormonal therapy. When using hormonal therapy, periods are eliminated. This eliminates the monthly exposure to blood by the displaced endometrial tissue.   Surgery. Surgery may sometimes be done to remove the abnormal endometrial tissue. In severe cases, surgery may be done to remove the fallopian tubes, uterus, and ovaries (hysterectomy). HOME CARE INSTRUCTIONS   Take all medicines as directed by your health care provider. Do not take aspirin because it may increase bleeding when you are not on hormonal therapy.   Avoid activities that produce pain, including sexual activity. SEEK MEDICAL CARE IF:  You have pelvic pain before, after, or during your periods.  You have pelvic pain between periods that gets worse during your period.  You have pelvic pain during or after sex.  You have pelvic pain with bowel movements or urination, especially during your period.  You have problems getting pregnant.  You have a fever. SEEK IMMEDIATE MEDICAL CARE IF:   Your pain is severe and is not responding to pain medicine.   You have severe nausea and vomiting, or you cannot keep foods down.   You have pain that is limited to the right lower part of your abdomen.   You have swelling or increasing pain in your abdomen.   You see blood in your stool.  MAKE SURE YOU:   Understand these instructions.  Will watch your condition.  Will get help right away if you are not doing well or get worse.   This information   is not intended to replace advice given to you by your health care provider. Make sure you discuss any questions you have with your health care provider.   Document Released: 04/19/2000 Document Revised: 05/13/2014 Document Reviewed: 12/18/2012 Elsevier Interactive Patient Education 2016 Elsevier Inc.  

## 2015-06-05 NOTE — MAU Provider Note (Signed)
History   [id number]   Chief Complaint  Patient presents with  . Abdominal Pain    HPI Sarah Chen is a 25 y.o. female  G0P0 here with report of stabbing pain in RLQ that started 1.5 wks ago.  Pt has a known history of endometriosis by lap. Last visit with Dr Debroah Loop in October 2016.  Pt was put on seasonique and to follow-up in 6 months.  Pt also reports seeing another doctor at [facility] for pain and was given an "injection" straight to pain site.  Pain improved for one day.  Feels this is a different pain.  Pain is rated 8/10.  Pain is described as a burning.   No LMP recorded. Patient has had an injection.  OB History  Gravida Para Term Preterm AB SAB TAB Ectopic Multiple Living  0         0        Past Medical History  Diagnosis Date  . Anxiety   . Knee pain   . Anxiety   . Anxiety     Family History  Problem Relation Age of Onset  . Arthritis Mother   . Mental illness Mother   . Mental illness Father   . Asthma Sister   . Mental illness Brother   . Diabetes Paternal Grandmother   . Heart disease Paternal Grandmother   . Cancer Paternal Grandfather     Social History   Social History  . Marital Status: Single    Spouse Name: N/A  . Number of Children: N/A  . Years of Education: N/A   Social History Main Topics  . Smoking status: Never Smoker   . Smokeless tobacco: Never Used  . Alcohol Use: Yes     Comment: occassionally  . Drug Use: No  . Sexual Activity: Yes    Birth Control/ Protection: Injection     Comment: BCP   Other Topics Concern  . None   Social History Narrative    No Known Allergies  No current facility-administered medications on file prior to encounter.   Current Outpatient Prescriptions on File Prior to Encounter  Medication Sig Dispense Refill  . amitriptyline (ELAVIL) 10 MG tablet TAKE 1 TABLET BY MOUTH AT BEDTIME. 30 tablet 2  . escitalopram (LEXAPRO) 10 MG tablet TAKE 1/2 TABLET DAILY FOR 7 DAYS THEN 1 TABLET BY MOUTH DAILY   2  . ibuprofen (ADVIL,MOTRIN) 800 MG tablet Take 800 mg by mouth every 8 (eight) hours as needed for mild pain or moderate pain.    . Levonorgestrel-Ethinyl Estradiol (AMETHIA,CAMRESE) 0.15-0.03 &0.01 MG tablet Take 1 tablet by mouth daily. 1 Package 4  . medroxyPROGESTERone (PROVERA) 10 MG tablet TAKE 2 TABLETS BY MOUTH DAILY. 30 tablet 5  . Multiple Vitamin (MULTIVITAMIN) capsule Take 2 capsules by mouth daily.        Review of Systems  Gastrointestinal: Negative for nausea and vomiting.  Genitourinary: Positive for pelvic pain. Negative for dysuria, frequency, vaginal bleeding, vaginal discharge and difficulty urinating.  All other systems reviewed and are negative.    Physical Exam   Filed Vitals:   06/04/15 2236 06/04/15 2335  BP: 137/90 130/83  Pulse: 116 97  Temp: 99 F (37.2 C)   TempSrc: Oral   Resp: 20 18  Height:  (1.676 m)   Weight: 64.774 kg (142 lb 12.8 oz)     Physical Exam  Constitutional: She is oriented to person, place, and time. She appears well-developed and well-nourished. No distress.  HENT:  Head: Normocephalic.  Neck: Normal range of motion. Neck supple.  Cardiovascular: Normal rate, regular rhythm and normal heart sounds.   Respiratory: Effort normal and breath sounds normal. No respiratory distress.  GI: There is splenomegaly. There is no rigidity, no rebound and no guarding.  Genitourinary: Cervix exhibits discharge ( clear). Cervix exhibits no motion tenderness. Right adnexum displays tenderness and fullness. Right adnexum displays no mass. Left adnexum displays no mass, no tenderness and no fullness.  Musculoskeletal: Normal range of motion. She exhibits no edema.  Neurological: She is alert and oriented to person, place, and time. She has normal reflexes.  Skin: Skin is warm and dry.    MAU Course  Procedures  MDM Results for orders placed or performed during the hospital encounter of 06/04/15 (from the past 24 hour(s))  Urinalysis,  Routine w reflex microscopic (not at [facility])     Status: None   Collection Time: 06/04/15 10:38 PM  Result Value Ref Range   Color, Urine YELLOW YELLOW   APPearance CLEAR CLEAR   Specific Gravity, Urine 1.010 1.005 - 1.030   pH 5.5 5.0 - 8.0   Glucose, UA NEGATIVE NEGATIVE mg/dL   Hgb urine dipstick NEGATIVE NEGATIVE   Bilirubin Urine NEGATIVE NEGATIVE   Ketones, ur NEGATIVE NEGATIVE mg/dL   Protein, ur NEGATIVE NEGATIVE mg/dL   Nitrite NEGATIVE NEGATIVE   Leukocytes, UA NEGATIVE NEGATIVE  Pregnancy, urine POC     Status: None   Collection Time: 06/04/15 11:24 PM  Result Value Ref Range   Preg Test, Ur NEGATIVE NEGATIVE  CBC     Status: None   Collection Time: 06/05/15 12:40 AM  Result Value Ref Range   WBC 7.1 4.0 - 10.5 K/uL   RBC 4.15 3.87 - 5.11 MIL/uL   Hemoglobin 12.2 12.0 - 15.0 g/dL   HCT 09.8 11.9 - 14.7 %   MCV 88.2 78.0 - 100.0 fL   MCH 29.4 26.0 - 34.0 pg   MCHC 33.3 30.0 - 36.0 g/dL   RDW 82.9 56.2 - 13.0 %   Platelets 229 150 - 400 K/uL   Ultrasound: IMPRESSION: Normal pelvic ultrasound.  Toradol 60 mg IM > pt reports decrease of pain 5/10 after Toradol  Assessment and Plan  Pelvic Pain  Plan: Discharge home Follow-up with GYN provider - pt desires surgical plan, explained that endometriosis could continue even if patient has hysterectomy   Marlis Edelson, CNM 06/05/2015 2:26 AM

## 2015-06-06 ENCOUNTER — Telehealth: Payer: Self-pay | Admitting: General Practice

## 2015-06-06 NOTE — Telephone Encounter (Signed)
Patient called and left message stating she was told when she was seen Sunday night she needed to be seen as soon as possible. Patient states she is a patient of Dr Debroah Loop and wasn't able to get an appt here until February. Patient states she is concerned about this and would like a call back. Reviewed MAU note from last night. Called back patient stating I am returning her call. Reassured patient that she did not need to be seen urgently as her ultrasound and lab work were normal. Told patient that her appt is the next available one for Dr Debroah Loop but she could certainly call us to see if we have had cancellations for the day. Patient verbalized understanding & had no other questions

## 2015-06-26 ENCOUNTER — Ambulatory Visit: Payer: Commercial Managed Care - PPO | Admitting: Obstetrics & Gynecology

## 2015-06-27 ENCOUNTER — Ambulatory Visit (INDEPENDENT_AMBULATORY_CARE_PROVIDER_SITE_OTHER): Payer: 59 | Admitting: Licensed Clinical Social Worker

## 2015-06-27 DIAGNOSIS — F3181 Bipolar II disorder: Secondary | ICD-10-CM | POA: Diagnosis not present

## 2015-07-06 ENCOUNTER — Ambulatory Visit (INDEPENDENT_AMBULATORY_CARE_PROVIDER_SITE_OTHER): Payer: 59 | Admitting: Obstetrics & Gynecology

## 2015-07-06 ENCOUNTER — Encounter: Payer: Self-pay | Admitting: Obstetrics & Gynecology

## 2015-07-06 VITALS — BP 127/82 | HR 71 | Temp 98.5°F | Ht 66.0 in | Wt 140.0 lb

## 2015-07-06 DIAGNOSIS — N809 Endometriosis, unspecified: Secondary | ICD-10-CM | POA: Diagnosis not present

## 2015-07-06 MED ORDER — LEVONORGEST-ETH ESTRAD 91-DAY 0.15-0.03 &0.01 MG PO TABS: 1.0000 | ORAL_TABLET | Freq: Every day | ORAL | Status: DC

## 2015-07-06 MED ORDER — GABAPENTIN 300 MG PO CAPS: 300.0000 mg | ORAL_CAPSULE | Freq: Three times a day (TID) | ORAL | Status: DC

## 2015-07-06 NOTE — Patient Instructions (Signed)
Gabapentin capsules or tablets What is this medicine? GABAPENTIN (GA ba pen tin) is used to control partial seizures in adults with epilepsy. It is also used to treat certain types of nerve pain. This medicine may be used for other purposes; ask your health care provider or pharmacist if you have questions. What should I tell my health care provider before I take this medicine? They need to know if you have any of these conditions: -kidney disease -suicidal thoughts, plans, or attempt; a previous suicide attempt by you or a family member -an unusual or allergic reaction to gabapentin, other medicines, foods, dyes, or preservatives -pregnant or trying to get pregnant -breast-feeding How should I use this medicine? Take this medicine by mouth with a glass of water. Follow the directions on the prescription label. You can take it with or without food. If it upsets your stomach, take it with food.Take your medicine at regular intervals. Do not take it more often than directed. Do not stop taking except on your doctor's advice. If you are directed to break the 600 or 800 mg tablets in half as part of your dose, the extra half tablet should be used for the next dose. If you have not used the extra half tablet within 28 days, it should be thrown away. A special MedGuide will be given to you by the pharmacist with each prescription and refill. Be sure to read this information carefully each time. Talk to your pediatrician regarding the use of this medicine in children. Special care may be needed. Overdosage: If you think you have taken too much of this medicine contact a poison control center or emergency room at once. NOTE: This medicine is only for you. Do not share this medicine with others. What if I miss a dose? If you miss a dose, take it as soon as you can. If it is almost time for your next dose, take only that dose. Do not take double or extra doses. What may interact with this medicine? Do not  take this medicine with any of the following medications: -other gabapentin products This medicine may also interact with the following medications: -alcohol -antacids -antihistamines for allergy, cough and cold -certain medicines for anxiety or sleep -certain medicines for depression or psychotic disturbances -homatropine; hydrocodone -naproxen -narcotic medicines (opiates) for pain -phenothiazines like chlorpromazine, mesoridazine, prochlorperazine, thioridazine This list may not describe all possible interactions. Give your health care provider a list of all the medicines, herbs, non-prescription drugs, or dietary supplements you use. Also tell them if you smoke, drink alcohol, or use illegal drugs. Some items may interact with your medicine. What should I watch for while using this medicine? Visit your doctor or health care professional for regular checks on your progress. You may want to keep a record at home of how you feel your condition is responding to treatment. You may want to share this information with your doctor or health care professional at each visit. You should contact your doctor or health care professional if your seizures get worse or if you have any new types of seizures. Do not stop taking this medicine or any of your seizure medicines unless instructed by your doctor or health care professional. Stopping your medicine suddenly can increase your seizures or their severity. Wear a medical identification bracelet or chain if you are taking this medicine for seizures, and carry a card that lists all your medications. You may get drowsy, dizzy, or have blurred vision. Do not drive, use   machinery, or do anything that needs mental alertness until you know how this medicine affects you. To reduce dizzy or fainting spells, do not sit or stand up quickly, especially if you are an older patient. Alcohol can increase drowsiness and dizziness. Avoid alcoholic drinks. Your mouth may get  dry. Chewing sugarless gum or sucking hard candy, and drinking plenty of water will help. The use of this medicine may increase the chance of suicidal thoughts or actions. Pay special attention to how you are responding while on this medicine. Any worsening of mood, or thoughts of suicide or dying should be reported to your health care professional right away. Women who become pregnant while using this medicine may enroll in the [place] American Antiepileptic Drug Pregnancy Registry by calling 1-888-233-2334. This registry collects information about the safety of antiepileptic drug use during pregnancy. What side effects may I notice from receiving this medicine? Side effects that you should report to your doctor or health care professional as soon as possible: -allergic reactions like skin rash, itching or hives, swelling of the face, lips, or tongue -worsening of mood, thoughts or actions of suicide or dying Side effects that usually do not require medical attention (report to your doctor or health care professional if they continue or are bothersome): -constipation -difficulty walking or controlling muscle movements -dizziness -nausea -slurred speech -tiredness -tremors -weight gain This list may not describe all possible side effects. Call your doctor for medical advice about side effects. You may report side effects to FDA at 1-800-FDA-1088. Where should I keep my medicine? Keep out of reach of children. This medicine may cause accidental overdose and death if it taken by other adults, children, or pets. Mix any unused medicine with a substance like cat litter or coffee grounds. Then throw the medicine away in a sealed container like a sealed bag or a coffee can with a lid. Do not use the medicine after the expiration date. Store at room temperature between 15 and 30 degrees C (59 and 86 degrees F). NOTE: This sheet is a summary. It may not cover all possible information. If you have  questions about this medicine, talk to your doctor, pharmacist, or health care provider.    2016, Elsevier/Gold Standard. (2013-06-18 15:26:50)  

## 2015-07-06 NOTE — Progress Notes (Signed)
Patient ID: Sarah Chen, female   DOB: Jan 26, 1991, 25 y.o.   MRN: [id number]  Chief Complaint  Patient presents with  . Follow-up  RLQ pain  HPI Sarah Chen is a 25 y.o. female.  G0P0 No LMP recorded. Patient has had an injection. Has been seeing Dr Yetta Barre in [facility] for pelvic trigger point injection, still has RLQ pain. Has started gabapentin no relief yet. Amenorrhea on OCP   HPI  Past Medical History  Diagnosis Date  . Anxiety   . Knee pain   . Anxiety   . Anxiety     Past Surgical History  Procedure Laterality Date  . Knee arthroscopy    . Knee surgery    . Laparoscopy N/A 06/28/2014    Procedure: LAPAROSCOPY DIAGNOSTIC, LYSIS OF ADHESIONS, CAUTERIZATION ENDOMETREOSIS ;  Surgeon: Adam Phenix, MD;  Location: [facility];  Service: Gynecology;  Laterality: N/A;  vaginal    Family History  Problem Relation Age of Onset  . Arthritis Mother   . Mental illness Mother   . Mental illness Father   . Asthma Sister   . Mental illness Brother   . Diabetes Paternal Grandmother   . Heart disease Paternal Grandmother   . Cancer Paternal Grandfather     Social History Social History  Substance Use Topics  . Smoking status: Never Smoker   . Smokeless tobacco: Never Used  . Alcohol Use: Yes     Comment: occassionally    No Known Allergies  Current Outpatient Prescriptions  Medication Sig Dispense Refill  . amoxicillin (AMOXIL) 500 MG capsule Take 500 mg by mouth 3 (three) times daily.    Marland Kitchen escitalopram (LEXAPRO) 10 MG tablet TAKE 1/2 TABLET DAILY FOR 7 DAYS THEN 1 TABLET BY MOUTH DAILY  2  . gabapentin (NEURONTIN) 300 MG capsule Take 1 capsule (300 mg total) by mouth 3 (three) times daily. 90 capsule 3  . lurasidone (LATUDA) 20 MG TABS tablet Take 20 mg by mouth daily.    . Multiple Vitamin (MULTIVITAMIN) capsule Take 2 capsules by mouth daily.     . predniSONE (DELTASONE) 20 MG tablet Take 20 mg by mouth daily with breakfast.     No current facility-administered  medications for this visit.    Review of Systems Review of Systems  Gastrointestinal: Negative for nausea.  Genitourinary: Positive for pelvic pain (RLq). Negative for vaginal bleeding, vaginal discharge and dyspareunia.    Blood pressure 127/82, pulse 71, temperature 98.5 F (36.9 C), temperature source Oral, height  (1.676 m), weight 140 lb (63.504 kg).  Physical Exam Physical Exam  Constitutional: She appears well-developed. No distress.  Pulmonary/Chest: Effort normal.  Abdominal: She exhibits no distension and no mass. There is no tenderness. There is no guarding.  Psychiatric: She has a normal mood and affect. Her behavior is normal.    Data Reviewed Office note Dr Yetta Barre  Assessment    Endometriosis Pelvic pain Dx Myofascial pain treatments   Amenorrhea on OCP  Plan    Refill Seasonique Increase dose of gabapentin to 300 mg TID Pain management referral RTC 6 weeks        ARNOLD,JAMES 07/06/2015, 1:35 PM

## 2015-07-10 ENCOUNTER — Ambulatory Visit: Payer: 59 | Admitting: Licensed Clinical Social Worker

## 2015-07-11 ENCOUNTER — Telehealth: Payer: Self-pay | Admitting: General Practice

## 2015-07-11 NOTE — Telephone Encounter (Signed)
Patient called and left message stating she is calling about her referral to the pain clinic. Patient states she hasn't received a phone call yet. Spoke to [employer]ee Graham who states patient did not [facility] and she was trying to find other pain clinic options for patient. Called patient and discussed that [facility] as they specialize in pelvic pain related to endometriosis. Told patient she can go to other providers in the area but they will not specialize in pelvic pain just chronic pain management. Patient verbalized understanding & states she is fine [facility] and was unaware of that. Told patient I will send referral in to them and she should await phone call. Patient verbalized understanding & had no questions

## 2015-07-17 ENCOUNTER — Encounter: Payer: Self-pay | Admitting: Obstetrics & Gynecology

## 2015-07-25 ENCOUNTER — Ambulatory Visit (INDEPENDENT_AMBULATORY_CARE_PROVIDER_SITE_OTHER): Payer: 59 | Admitting: Licensed Clinical Social Worker

## 2015-07-25 DIAGNOSIS — F3181 Bipolar II disorder: Secondary | ICD-10-CM

## 2015-07-28 ENCOUNTER — Encounter: Payer: Self-pay | Admitting: Obstetrics & Gynecology

## 2015-08-01 ENCOUNTER — Telehealth: Payer: Self-pay | Admitting: *Deleted

## 2015-08-01 NOTE — Telephone Encounter (Signed)
Pt left message stating that she had sent a message via MyChart last week and has not had a response. She has been trying to lose weight and has been unsuccessful. She thinks the gabapentin may be inhibiting her ability to lose weight and wants to know if Dr. Debroah LoopArnold may want to prescribe another medication for her pain. I called pt and advised her that we received her message via MyChart. A message has been sent to Dr. Debroah LoopArnold and we will call her back once a response is received.  Pt voiced understanding.

## 2015-08-09 NOTE — Telephone Encounter (Signed)
Sorry I had missed this, we could do Lyrica instead if she wants to try something different.  Adam PhenixJames G Azalea Cedar, MD

## 2015-08-21 ENCOUNTER — Ambulatory Visit: Payer: 59 | Admitting: Licensed Clinical Social Worker

## 2015-08-21 ENCOUNTER — Ambulatory Visit: Payer: Self-pay | Admitting: Obstetrics & Gynecology

## 2015-08-24 ENCOUNTER — Telehealth: Payer: Self-pay

## 2015-08-24 NOTE — Telephone Encounter (Signed)
Pt left a message stating she is having some bleeding. This has never happened before so patient wanted to make sure this was normal with her being on birth control.I explained to patient this is normal side effect to have some break out bleeding and to keep an watch on the bleeding. Pt verbalizes understanding.

## 2015-08-30 ENCOUNTER — Encounter: Payer: Self-pay | Admitting: Obstetrics & Gynecology

## 2015-08-30 ENCOUNTER — Telehealth: Payer: Self-pay | Admitting: *Deleted

## 2015-08-30 NOTE — Telephone Encounter (Signed)
Sarah Chen called yesterday am 11:34 am and left a message stating she is a patient of Dr. Debroah LoopArnold and he was supposed to refer her to [facility].  States she called them and they stated they had not gotten the referral.  She also states she had also called and asked if there was something she could take instead Of gabapentin but had not heard back.   Per chart review Dr. Debroah LoopArnold did order a pain referral at her last visit- but it has not been done.  Also do see call re gabapentin and Dr. Debroah LoopArnold Did reply pt could try abilfy- but it does not look like patient was called.   I called Sarah Chen and we discussed  I do see that Dr. Debroah LoopArnold ordered a referral and it appears we have not started that yet- but we will start it asap.  Also I informed her he did respond to her question and states she could try abilify.  Sarah Chen states she has been on abilify before and that is when she gained  15 pounds so she does not want to restart that. She states with the gabapentine it seems to have made it hard to lose weight, so she stopped taking it. She wants to know if there is something else besides gabapentin and abilify that she can try that will not make her gain weight or make it hard to lose weight.  I informed her I would send Dr. Debroah LoopArnold a message and when we hear back, we will call her. To call us back , if she does not hear back by Monday. She voices understanding.

## 2015-09-07 ENCOUNTER — Encounter: Payer: Self-pay | Admitting: Obstetrics & Gynecology

## 2015-09-07 DIAGNOSIS — N809 Endometriosis, unspecified: Secondary | ICD-10-CM

## 2015-09-07 MED ORDER — PREGABALIN 75 MG PO CAPS: 75.0000 mg | ORAL_CAPSULE | Freq: Two times a day (BID) | ORAL | Status: DC

## 2015-09-07 NOTE — Telephone Encounter (Signed)
Addressed under different encounter.

## 2015-09-27 ENCOUNTER — Telehealth: Payer: Self-pay | Admitting: General Practice

## 2015-09-27 NOTE — Telephone Encounter (Signed)
Patient called and left message stating she has been waiting for an appt with the pain clinic for two months now & still hasn't heard anything back. [facility] & spoke with front office staff who stated they have received the referral on 5/10 & go in order of when the referral was received. She states they are working from April so it might be another 2-3 weeks. Called patient stating I am returning her phone call & informed her of phone call made to the office & that it would be another 2 weeks or so before she hears from the office but they have received her information. Patient verbalized understanding & had no questions

## 2015-10-04 NOTE — Telephone Encounter (Signed)
Follow up call to pt regarding Lyrica. She states that she has been taking the medication for about a month and it is helping her more than the gabapentin. As previously noted, pt is still waiting for app[facility].

## 2015-10-31 DIAGNOSIS — M62838 Other muscle spasm: Secondary | ICD-10-CM | POA: Insufficient documentation

## 2015-11-23 ENCOUNTER — Ambulatory Visit: Payer: 59 | Attending: Student | Admitting: Physical Therapy

## 2015-11-23 ENCOUNTER — Encounter: Payer: Self-pay | Admitting: Physical Therapy

## 2015-11-23 DIAGNOSIS — M6281 Muscle weakness (generalized): Secondary | ICD-10-CM | POA: Insufficient documentation

## 2015-11-23 DIAGNOSIS — R279 Unspecified lack of coordination: Secondary | ICD-10-CM | POA: Diagnosis present

## 2015-11-23 DIAGNOSIS — M62838 Other muscle spasm: Secondary | ICD-10-CM | POA: Diagnosis not present

## 2015-11-23 NOTE — Therapy (Addendum)
[facility] [address], [place], [place], [address] Phone: [phone]   Fax:  [phone]  Physical Therapy Evaluation  Patient Details  Name: Sarah Chen MRN: [id number] Date of Birth: [birth year] Referring Provider: Dr. Lennox Laity  Encounter Date: 11/23/2015      PT End of Session - 11/23/15 0929    Visit Number 1   Date for PT Re-Evaluation 01/18/16   Authorization Type [employer]    PT Start Time 0845   PT Stop Time 0930   PT Time Calculation (min) 45 min   Activity Tolerance Patient tolerated treatment well   Behavior During Therapy [facility] for tasks assessed/performed      Past Medical History  Diagnosis Date  . Anxiety   . Knee pain   . Anxiety   . Anxiety     Past Surgical History  Procedure Laterality Date  . Knee arthroscopy    . Knee surgery    . Laparoscopy N/A 06/28/2014    Procedure: LAPAROSCOPY DIAGNOSTIC, LYSIS OF ADHESIONS, CAUTERIZATION ENDOMETREOSIS ;  Surgeon: Woodroe Mode, MD;  Location: [facility];  Service: Gynecology;  Laterality: N/A;  vaginal    There were no vitals filed for this visit.       Subjective Assessment - 11/23/15 0855    Subjective Patient reports pain in the lower abdomen and suprapubic area.  Patient has endometriosis. Patient has had laproscopic surgery.    Patient Stated Goals stop taking pain medication   Currently in Pain? Yes   Pain Score 7    Pain Location Abdomen  mostly on right   Pain Orientation Lower   Pain Descriptors / Indicators Constant;Stabbing   Pain Type Chronic pain   Pain Radiating Towards radiate to back and left lower abdominal area   Pain Onset More than a month ago   Pain Frequency Constant   Aggravating Factors  running, activity   Pain Relieving Factors heat pad   Multiple Pain Sites No            [facility] PT Assessment - 11/23/15 0001    Assessment   Medical Diagnosis N94.89 high tone pelvic floor dysfunction   Referring Provider Dr. Lennox Laity   Onset Date/Surgical Date 04/05/14   Prior Therapy None   Precautions   Precautions None   Restrictions   Weight Bearing Restrictions No   Balance Screen   Has the patient fallen in the past 6 months No   Has the patient had a decrease in activity level because of a fear of falling?  No   Is the patient reluctant to leave their home because of a fear of falling?  No   Home Ecologist residence   Prior Function   Level of Independence Independent   Vocation Full time employment   Vocation Requirements sitting   Leisure walking, running; yoga   Cognition   Overall Cognitive Status Within Functional Limits for tasks assessed   Observation/Other Assessments   Focus on Therapeutic Outcomes (FOTO)  46% limitation   ROM / Strength   AROM / PROM / Strength AROM;Strength   AROM   Lumbar Extension decreased by 25%   Lumbar - Right Side Bend decreased by 25%   Lumbar - Left Side Bend decreased by 25%   Strength   Strength Assessment Site Hip   Right/Left Hip Right;Left   Left Hip External Rotation 4/5   Left Hip Internal Rotation 4/5   Left Hip  ABduction 3+/5   Palpation   SI assessment  L1-L5 decreased mobility   Palpation comment tenderness located in right lower abdominal and suprapubicall,    Special Tests    Special Tests Hip Special Tests   Hip Special Tests  Trendelenberg Test   Trendelenburg Test   Findings Positive   Side Right;Left   Comments right worse than left                 Pelvic Floor Special Questions - 11/23/15 0001    Currently Sexually Active No   Marinoff Scale discomfort that does not affect completion  pain after for several days, initial penetration   Urinary Leakage No   Skin Integrity Intact   Scar none   Perineal Body/Introitus  Elevated   External Palpation tenderness located on bulbocavernosus and ischiocavernosus   Prolapse None   Pelvic Floor Internal Exam  Patient confirmed idtification and approved PT to assess pelvic floor muscle strength and integrity   Exam Type Vaginal   Palpation tenderness located on right obturator internist and levator ani   Strength weak squeeze, no lift  decreased contraction on the right side of introitus   Tone increased tone of right side of pelvic floor          [facility] Adult PT Treatment/Exercise - 11/23/15 0001    Posture/Postural Control   Posture/Postural Control Postural limitations   Postural Limitations Rounded Shoulders;Forward head;Flexed trunk                PT Education - 11/23/15 0928    Education provided Yes   Education Details perineal soft tissue work, bearing down to stretch pelvic floor   Person(s) Educated Patient   Methods Explanation;Demonstration;Verbal cues;Handout   Comprehension Returned demonstration;Verbalized understanding          PT Short Term Goals - 11/23/15 0944    PT SHORT TERM GOAL #1   Title independent with initial HEP   Time 4   Period Weeks   Status New   PT SHORT TERM GOAL #2   Title able to return demonstration of perneal massage   Time 4   Period Weeks   Status New   PT SHORT TERM GOAL #3   Title pain decreased >/= 25% with daily activities   Time 4   Period Weeks   Status New   PT SHORT TERM GOAL #4   Title ability to contract the right side of pelvic floor muscles due to decreased in restrictions   Time 4   Period Weeks   Status New           PT Long Term Goals - 11/23/15 0945    PT LONG TERM GOAL #1   Title independent with HEP   Time 8   Period Weeks   Status New   PT LONG TERM GOAL #2   Title pelvic floor strength is 4/5 with circular contraction and left followed by full downard movement of pelvic floor   Time 8   Period Weeks   Status New   PT LONG TERM GOAL #3   Title pain decreased >/= 75% and understands how to manage it   Time 8   Period Weeks   Status New   PT LONG TERM GOAL #5   Title ability to run with  pain decreased >/= 50%   Time 8   Period Weeks   Status New  Plan - 11/23/15 0936    Clinical Impression Statement Patient is a 25 year old female with diagnosis of high tone pelvic floor since 04/05/2014 when she was having increased bleeding during her menstraul cycle. Patient has had laproscopic surgery on 2/23/206 showing endometriosis.  Patient reports constant pain in right lower abdominal area at level  7/10 that will radiate to her back and left lower abdominal areal.  Tightness is located in lower abdominal area.  Right ilium is rotated anteriorly.  Decreased movement of L1-L5.  Lumbar ROM is limited by 25%.  Decreased strength of of hips.  Pelvic floor strength is 2/5 with decreased contraction on the right side of the introitus.  Palpable tenderness located in right ischiocavernosus, bulbocavernosus, right obturator internist and levator ani.  Patient has difficulty with bulging her pelvic floor due to increased tone.  Patient is of low complexity.  Patient  will benefit from physical therapy to reduce pain and tone of pelvic floor.     Rehab Potential Excellent   Clinical Impairments Affecting Rehab Potential None   PT Frequency 1x / week   PT Duration 8 weeks   PT Treatment/Interventions Biofeedback;Cryotherapy;Electrical Stimulation;Ultrasound;Moist Heat;Therapeutic activities;Therapeutic exercise;Patient/family education;Neuromuscular re-education;Manual techniques;Dry needling   PT Next Visit Plan soft tissue work to perineum and abdomen, correct pelvis, plevic meditation, stretches to hips   PT Home Exercise Plan stretches, pelvic floor meditation   Recommended Other Services None   Consulted and Agree with Plan of Care Patient      Patient will benefit from skilled therapeutic intervention in order to improve the following deficits and impairments:  Increased fascial restricitons, Pain, Decreased mobility, Hypermobility, Increased muscle spasms, Decreased  strength, Decreased range of motion, Decreased endurance, Decreased activity tolerance, Impaired flexibility  Visit Diagnosis: Other muscle spasm - Plan: PT plan of care cert/re-cert  Unspecified lack of coordination - Plan: PT plan of care cert/re-cert  Muscle weakness (generalized) - Plan: PT plan of care cert/re-cert     Problem List Patient Active Problem List   Diagnosis Date Noted  . Endometriosis determined by laparoscopy 06/28/2014  . Pelvic pain in female 03/17/2014  . Initiation of Depo Provera 03/17/2014    Earlie Counts, PT  [facility] [address] [place], [place], [address] Phone: [phone]   Fax:  [phone]  Name: Sarah Chen MRN: [id number] Date of Birth: 18-Nov-1990   PHYSICAL THERAPY DISCHARGE SUMMARY  Visits from Start of Care: 1  Current functional level related to goals / functional outcomes: See above. Unable to assess patient due to not returning after the initial evaluation.    Remaining deficits: See above.    Education / Equipment: HEP Plan:                                                    Patient goals were not met. Patient is being discharged due to not returning since the last visit.  Thank you for the referral. Earlie Counts, PT 01/16/16 1:54 PM  ?????

## 2015-11-23 NOTE — Patient Instructions (Signed)
Bear Down    Inhale filling stomach up with air, Exhaling, bear down as if to have a bowel movement or deliver a baby. Hold 5 sec. Rest 5 sec.  Repeat _5__ times. Do _3__ times a day.  Copyright  VHI. All rights reserved.  Massage the perineum.  First press thumb toward anus holding 2 min then stroke side to side.  Then place left thumb in vagina and stroke right side of vaginal canal 50 times.  1 time per day.   [facility] [address], [address] [place], [place] [address] Phone # [phone] Fax [phone]

## 2015-11-29 ENCOUNTER — Ambulatory Visit: Payer: 59 | Admitting: Physical Therapy

## 2015-11-30 ENCOUNTER — Other Ambulatory Visit: Payer: Self-pay | Admitting: Obstetrics & Gynecology

## 2015-12-14 ENCOUNTER — Ambulatory Visit: Payer: 59 | Admitting: Physical Therapy

## 2015-12-19 ENCOUNTER — Ambulatory Visit: Payer: 59 | Attending: Student | Admitting: Physical Therapy

## 2015-12-28 ENCOUNTER — Ambulatory Visit: Payer: 59 | Admitting: Physical Therapy

## 2016-01-01 ENCOUNTER — Encounter: Payer: Self-pay | Admitting: Physical Therapy

## 2016-02-18 IMAGING — US US TRANSVAGINAL NON-OB
1 series · 14 of 25 positions shown · non-contrast
Comparison: None

CLINICAL DATA: Pelvic pain



[Series 1: us pelvis complete · 14 of 41 slices shown]
[im 1/41]
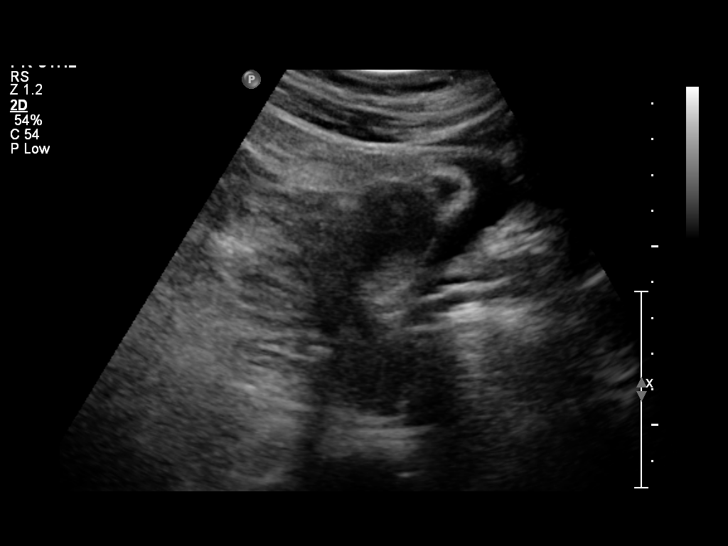
[im 4/41]
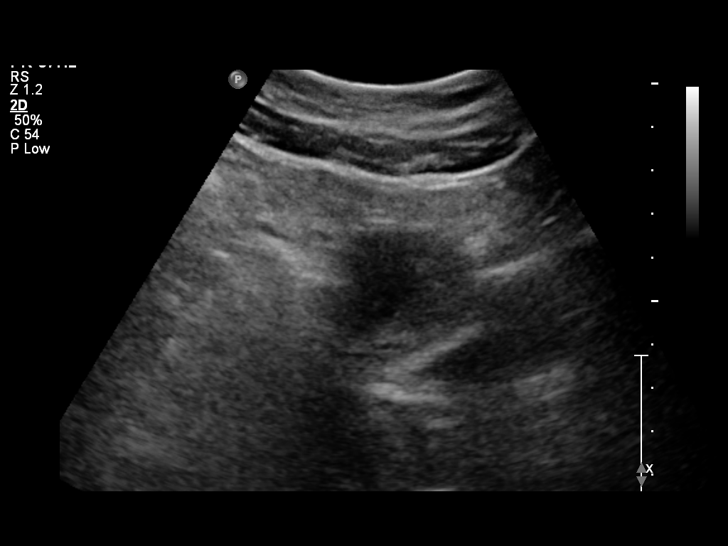
[im 7/41]
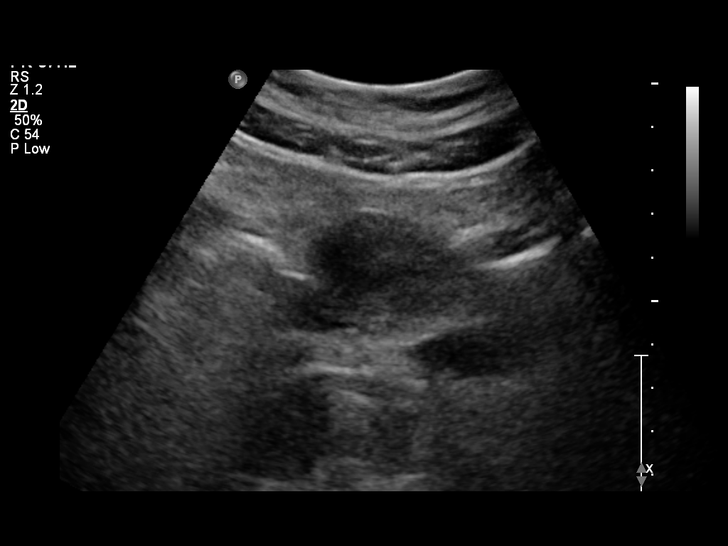
[im 11/41]
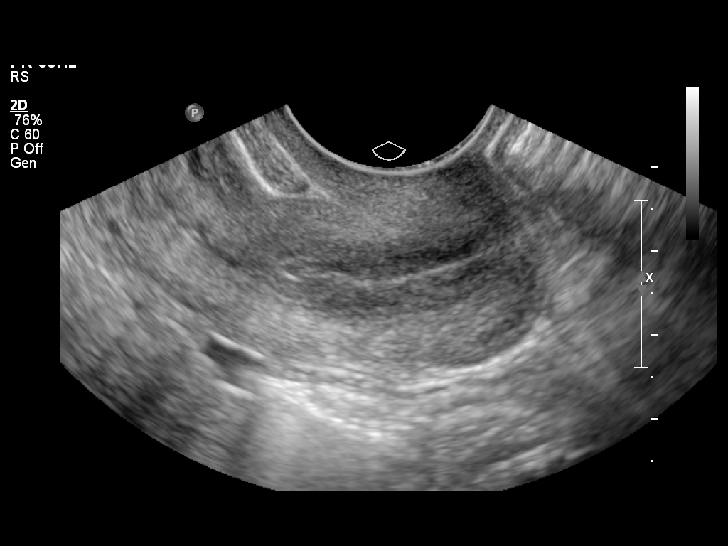
[im 14/41]
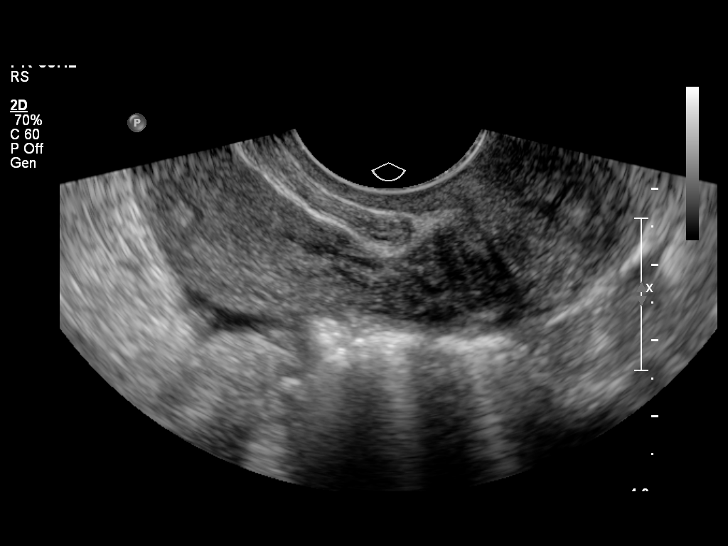
[im 16/41]
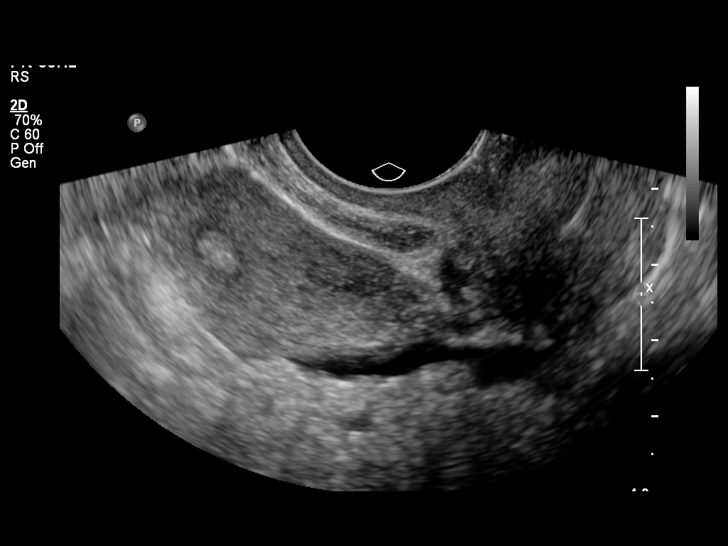
[im 19/41]
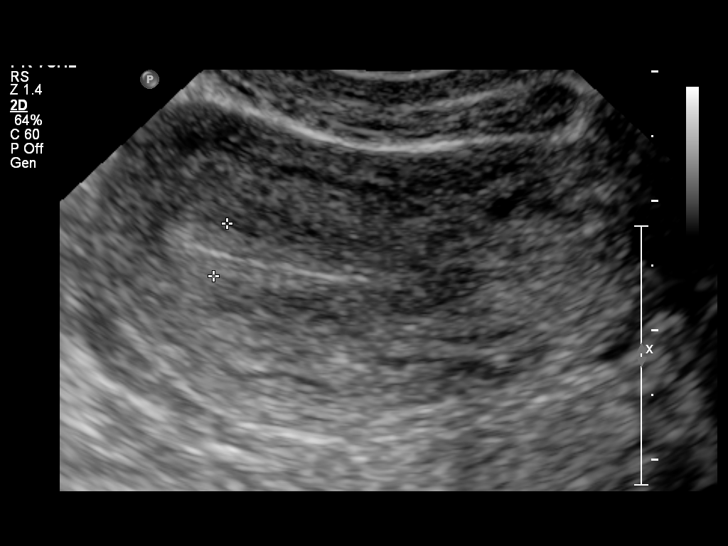
[im 22/41]
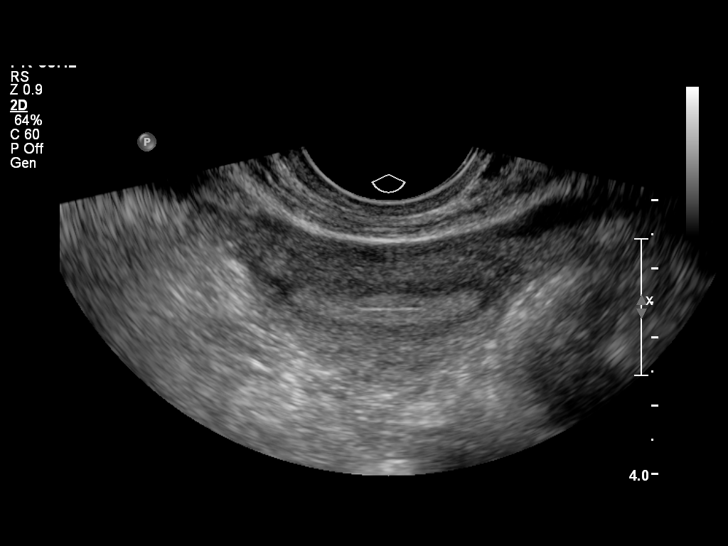
[im 26/41]
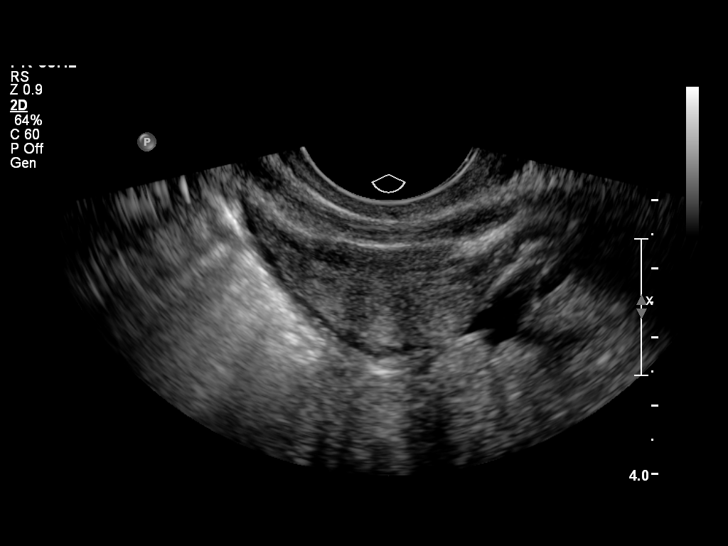
[im 27/41]
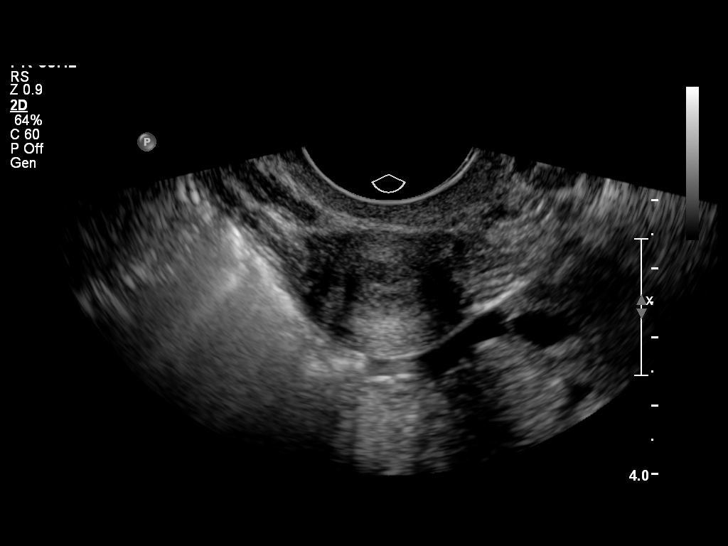
[im 31/41]
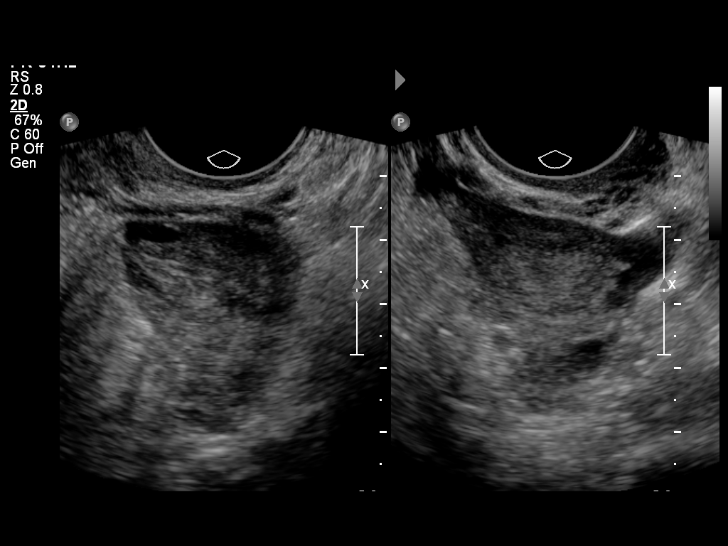
[im 34/41]
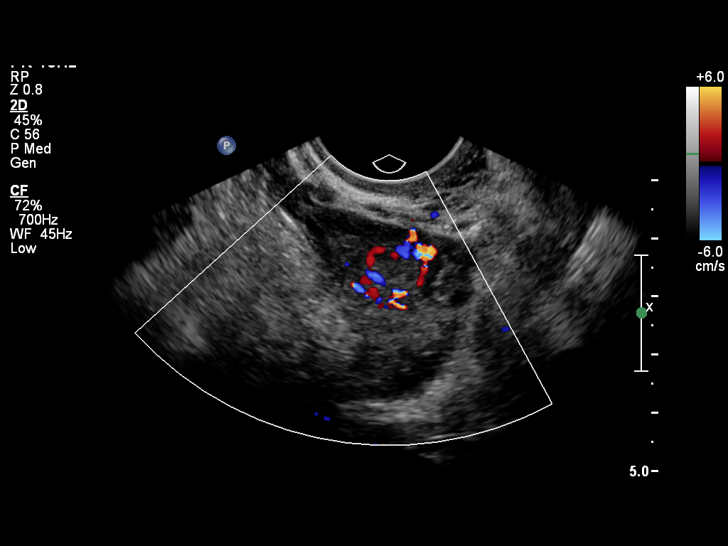
[im 37/41]
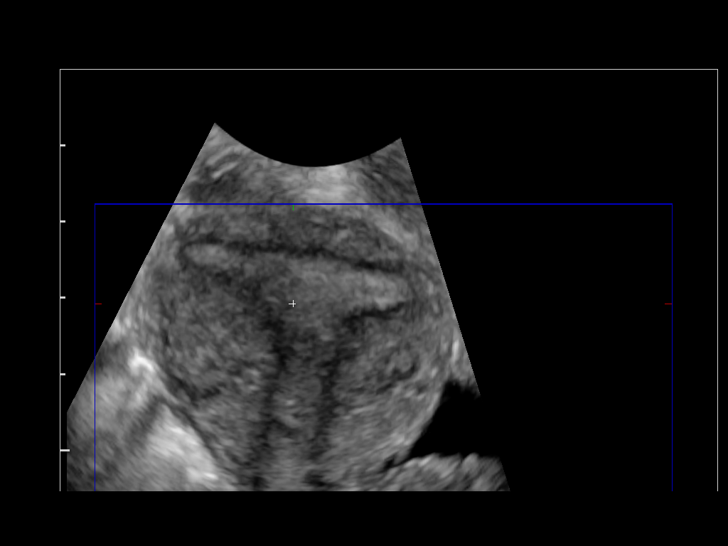
[im 41/41]
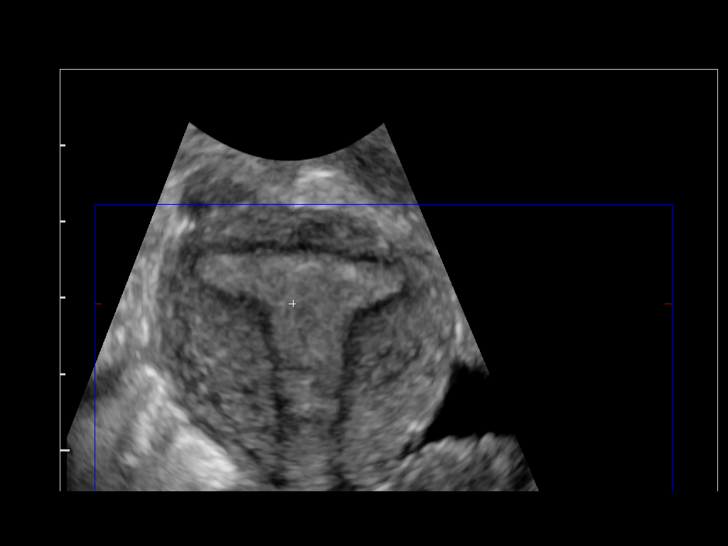

[14 of 25 positions shown; findings below may reference images not displayed]

FINDINGS: Uterus

Measurements: 7 x 2.4 x 4.3 cm.. No fibroids or other mass
visualized.

Endometrium

Thickness: 4.2 mm.  No focal abnormality visualized.

Right ovary

Measurements: 3.9 x 2.0 x 1.8 cm. Normal appearance/no adnexal mass.

Left ovary

Measurements: 3.3 x 2.8 x 2.6 cm.. Normal appearance/no adnexal
mass.

Other findings

Small amount of free fluid.
IMPRESSION: 1. Essentially normal pelvic sonogram.
2. Free fluid within the pelvis may be physiologic in a
premenopausal female.

## 2016-02-19 ENCOUNTER — Encounter: Payer: Self-pay | Admitting: General Practice

## 2016-02-19 ENCOUNTER — Encounter: Payer: Self-pay | Admitting: Obstetrics & Gynecology

## 2016-02-19 DIAGNOSIS — R635 Abnormal weight gain: Secondary | ICD-10-CM | POA: Insufficient documentation

## 2016-02-19 DIAGNOSIS — R102 Pelvic and perineal pain: Secondary | ICD-10-CM

## 2016-02-19 DIAGNOSIS — N809 Endometriosis, unspecified: Secondary | ICD-10-CM

## 2016-02-22 MED ORDER — LEVONORGESTREL-ETHINYL ESTRAD 0.1-20 MG-MCG PO TABS: 1.0000 | ORAL_TABLET | Freq: Every day | ORAL | 3 refills | Status: DC

## 2016-04-12 ENCOUNTER — Encounter: Payer: Self-pay | Admitting: Obstetrics & Gynecology

## 2016-04-12 ENCOUNTER — Inpatient Hospital Stay (HOSPITAL_COMMUNITY)
Admission: AD | Admit: 2016-04-12 | Discharge: 2016-04-12 | Disposition: A | Discharge status: Home / Self Care | Payer: 59 | Source: Ambulatory Visit | Attending: Family Medicine | Admitting: Family Medicine

## 2016-04-12 ENCOUNTER — Encounter (HOSPITAL_COMMUNITY): Payer: Self-pay | Admitting: *Deleted

## 2016-04-12 DIAGNOSIS — R1031 Right lower quadrant pain: Secondary | ICD-10-CM | POA: Diagnosis not present

## 2016-04-12 DIAGNOSIS — N809 Endometriosis, unspecified: Secondary | ICD-10-CM

## 2016-04-12 HISTORY — DX: Endometriosis, unspecified: N80.9

## 2016-04-12 HISTORY — DX: Mental disorder, not otherwise specified: F99

## 2016-04-12 LAB — URINALYSIS, ROUTINE W REFLEX MICROSCOPIC
Bilirubin Urine: NEGATIVE
Glucose, UA: NEGATIVE mg/dL
Hgb urine dipstick: NEGATIVE
Ketones, ur: NEGATIVE mg/dL
LEUKOCYTES UA: NEGATIVE
Nitrite: NEGATIVE
PROTEIN: NEGATIVE mg/dL
Specific Gravity, Urine: 1.01 (ref 1.005–1.030)
pH: 6 (ref 5.0–8.0)

## 2016-04-12 LAB — CBC WITH DIFFERENTIAL/PLATELET
BASOS PCT: 0 %
Basophils Absolute: 0 10*3/uL (ref 0.0–0.1)
EOS ABS: 0.1 10*3/uL (ref 0.0–0.7)
EOS PCT: 2 %
HCT: 37.2 % (ref 36.0–46.0)
HEMOGLOBIN: 12.6 g/dL (ref 12.0–15.0)
Lymphocytes Relative: 30 %
Lymphs Abs: 2 10*3/uL (ref 0.7–4.0)
MCH: 29.8 pg (ref 26.0–34.0)
MCHC: 33.9 g/dL (ref 30.0–36.0)
MCV: 87.9 fL (ref 78.0–100.0)
MONOS PCT: 7 %
Monocytes Absolute: 0.5 10*3/uL (ref 0.1–1.0)
NEUTROS PCT: 61 %
Neutro Abs: 4 10*3/uL (ref 1.7–7.7)
PLATELETS: 225 10*3/uL (ref 150–400)
RBC: 4.23 MIL/uL (ref 3.87–5.11)
RDW: 12.4 % (ref 11.5–15.5)
WBC: 6.5 10*3/uL (ref 4.0–10.5)

## 2016-04-12 LAB — WET PREP, GENITAL
Clue Cells Wet Prep HPF POC: NONE SEEN
SPERM: NONE SEEN
TRICH WET PREP: NONE SEEN
YEAST WET PREP: NONE SEEN

## 2016-04-12 LAB — POCT PREGNANCY, URINE: Preg Test, Ur: NEGATIVE

## 2016-04-12 MED ORDER — KETOROLAC TROMETHAMINE 60 MG/2ML IM SOLN
60.0000 mg | Freq: Once | INTRAMUSCULAR | Status: AC
  Administered 2016-04-12: 60 mg via INTRAMUSCULAR
  Filled 2016-04-12: qty 2

## 2016-04-12 MED ORDER — IBUPROFEN 800 MG PO TABS: 800.0000 mg | ORAL_TABLET | Freq: Three times a day (TID) | ORAL | 1 refills | Status: AC | PRN

## 2016-04-12 NOTE — Discharge Instructions (Signed)
Endometriosis Endometriosis is a condition in which the tissue that lines the uterus (endometrium) grows outside of its normal location. The tissue may grow in many locations close to the uterus, but it commonly grows on the ovaries, fallopian tubes, vagina, or bowel. When the uterus sheds the endometrium every menstrual cycle, there is bleeding wherever the endometrial tissue is located. This can cause pain because blood is irritating to tissues that are not normally exposed to it. What are the causes? The cause of endometriosis is not known. What increases the risk? You may be more likely to develop endometriosis if you:  Have a family history of endometriosis.  Have never given birth.  Started your period at age 52 or younger.  Have high levels of estrogen in your body.  Were exposed to a certain medicine (diethylstilbestrol) before you were born (in utero).  Had low birth weight.  Were born as a twin, triplet, or other multiple.  Have a BMI of less than 25. BMI is an estimate of body fat and is calculated from height and weight. What are the signs or symptoms? Often, there are no symptoms of this condition. If you do have symptoms, they may:  Vary depending on where your endometrial tissue is growing.  Occur during your menstrual period (most common) or midcycle.  Come and go, or you may go months with no symptoms at all.  Stop with menopause. Symptoms may include:  Pain in the back or abdomen.  Heavier bleeding during periods.  Pain during sex.  Painful bowel movements.  Infertility.  Pelvic pain.  Bleeding more than once a month. How is this diagnosed? This condition is diagnosed based on your symptoms and a physical exam. You may have tests, such as:  Blood tests and urine tests. These may be done to help rule out other possible causes of your symptoms.  Ultrasound, to look for abnormal tissues.  An X-ray of the lower bowel (barium enema).  An ultrasound  that is done through the vagina (transvaginally).  CT scan.  MRI.  Laparoscopy. In this procedure, a lighted, pencil-sized instrument called a laparoscope is inserted into your abdomen through an incision. The laparoscope allows your health care provider to look at the organs inside your body and check for abnormal tissue to confirm the diagnosis. If abnormal tissue is found, your health care provider may remove a small piece of tissue (biopsy) to be examined under a microscope. How is this treated? Treatment for this condition may include:  Medicines to relieve pain, such as NSAIDs.  Hormone therapy. This involves using artificial (synthetic) hormones to reduce endometrial tissue growth. Your health care provider may recommend using a hormonal form of birth control, or other medicines.  Surgery. This may be done to remove abnormal endometrial tissue.  In some cases, tissue may be removed using a laparoscope and a laser (laparoscopic laser treatment).  In severe cases, surgery may be done to remove the fallopian tubes, uterus, and ovaries (hysterectomy). Follow these instructions at home:  Take over-the-counter and prescription medicines only as told by your health care provider.  Do not drive or use heavy machinery while taking prescription pain medicine.  Try to avoid activities that cause pain, including sexual activity.  Keep all follow-up visits as told by your health care provider. This is important. Contact a health care provider if:  You have pain in the area between your hip bones (pelvic area) that occurs:  Before, during, or after your period.  In  between your period and gets worse during your period.  During or after sex.  With bowel movements or urination, especially during your period.  You have problems getting pregnant.  You have a fever. Get help right away if:  You have severe pain that does not get better with medicine.  You have severe nausea and  vomiting, or you cannot eat without vomiting.  You have pain that affects only the lower, right side of your abdomen.  You have abdominal pain that gets worse.  You have abdominal swelling.  You have blood in your stool. This information is not intended to replace advice given to you by your health care provider. Make sure you discuss any questions you have with your health care provider. Document Released: 04/19/2000 Document Revised: 01/26/2016 Document Reviewed: 09/23/2015 Elsevier Interactive Patient Education  2017 [employer]   Acute Pain, Adult Acute pain is a type of pain that may last for just a few days or as long as six months. It is often related to an illness, injury, or medical procedure. Acute pain may be mild, moderate, or severe. It usually goes away once your injury has healed or you are no longer ill. Pain can make it hard for you to do daily activities. It can cause anxiety and lead to other problems if left untreated. Treatment depends on the cause and severity of your acute pain. Follow these instructions at home:  Check your pain level as told by your health care provider.  Take over-the-counter and prescription medicines only as told by your health care provider.  If you are taking prescription pain medicine:  Ask your health care provider about taking a stool softener or laxative to prevent constipation.  Do not stop taking the medicine suddenly. Talk to your health care provider about how and when to discontinue prescription pain medicine.  If your pain is severe, do not take more pills than instructed by your health care provider.  Do not take other over-the-counter pain medicines in addition to this medicine unless told by your health care provider.  Do not drive or operate heavy machinery while taking prescription pain medicine.  Apply ice or heat as told by your health care provider. These may reduce swelling and pain.  Ask your health care  provider if other strategies such as distraction, relaxation, or physical therapies can help your pain.  Keep all follow-up visits as told by your health care provider. This is important. Contact a health care provider if:  You have pain that is not controlled by medicine.  Your pain does not improve or gets worse.  You have side effects from pain medicines, such as vomitingor confusion. Get help right away if:  You have severe pain.  You have trouble breathing.  You lose consciousness.  You have chest pain or pressure that lasts for more than a few minutes. Along with the chest pain you may:  Have pain or discomfort in one or both arms, your back, neck, jaw, or stomach.  Have shortness of breath.  Break out in a cold sweat.  Feel nauseous.  Become light-headed. These symptoms may represent a serious problem that is an emergency. Do not wait to see if the symptoms will go away. Get medical help right away. Call your local emergency services (911 in the [place] Do not drive yourself to the hospital.  This information is not intended to replace advice given to you by your health care provider. Make sure you discuss any  questions you have with your health care provider. Document Released: 05/07/2015 Document Revised: 09/29/2015 Document Reviewed: 05/07/2015 Elsevier Interactive Patient Education  2017 [employer]

## 2016-04-12 NOTE — MAU Note (Signed)
Patient has been having right lower quadrant abdominal pain for past two days that is constant and unrelieved by warm baths or taking gabapentin at home.  Pt has been on BC pills for 2 years for endometriosis and has not had a period since 2015.  Denies vaginal discharge or bleeding/spotting.

## 2016-04-13 LAB — HIV ANTIBODY (ROUTINE TESTING W REFLEX): HIV Screen 4th Generation wRfx: NONREACTIVE

## 2016-04-15 LAB — GC/CHLAMYDIA PROBE AMP (~~LOC~~) NOT AT ARMC
Chlamydia: NEGATIVE
Neisseria Gonorrhea: NEGATIVE

## 2016-05-08 ENCOUNTER — Other Ambulatory Visit: Payer: Self-pay | Admitting: Obstetrics & Gynecology

## 2016-05-13 ENCOUNTER — Ambulatory Visit: Payer: Self-pay | Admitting: Obstetrics & Gynecology

## 2016-07-18 ENCOUNTER — Other Ambulatory Visit: Payer: Self-pay | Admitting: Obstetrics & Gynecology

## 2016-07-18 DIAGNOSIS — N809 Endometriosis, unspecified: Secondary | ICD-10-CM

## 2016-07-18 DIAGNOSIS — R102 Pelvic and perineal pain: Secondary | ICD-10-CM

## 2016-10-13 ENCOUNTER — Emergency Department (HOSPITAL_COMMUNITY)
Admission: EM | Admit: 2016-10-13 | Discharge: 2016-10-13 | Disposition: A | Discharge status: Home / Self Care | Payer: 59 | Attending: Emergency Medicine | Admitting: Emergency Medicine

## 2016-10-13 ENCOUNTER — Encounter (HOSPITAL_COMMUNITY): Payer: Self-pay | Admitting: Emergency Medicine

## 2016-10-13 DIAGNOSIS — F10920 Alcohol use, unspecified with intoxication, uncomplicated: Secondary | ICD-10-CM

## 2016-10-13 DIAGNOSIS — G4700 Insomnia, unspecified: Secondary | ICD-10-CM | POA: Diagnosis not present

## 2016-10-13 DIAGNOSIS — Z79899 Other long term (current) drug therapy: Secondary | ICD-10-CM | POA: Diagnosis not present

## 2016-10-13 DIAGNOSIS — F32A Depression, unspecified: Secondary | ICD-10-CM

## 2016-10-13 DIAGNOSIS — F10120 Alcohol abuse with intoxication, uncomplicated: Secondary | ICD-10-CM | POA: Insufficient documentation

## 2016-10-13 DIAGNOSIS — F330 Major depressive disorder, recurrent, mild: Secondary | ICD-10-CM

## 2016-10-13 DIAGNOSIS — F329 Major depressive disorder, single episode, unspecified: Secondary | ICD-10-CM | POA: Diagnosis not present

## 2016-10-13 DIAGNOSIS — Z818 Family history of other mental and behavioral disorders: Secondary | ICD-10-CM

## 2016-10-13 LAB — RAPID URINE DRUG SCREEN, HOSP PERFORMED
Amphetamines: NOT DETECTED
Barbiturates: NOT DETECTED
Benzodiazepines: NOT DETECTED
Cocaine: NOT DETECTED
OPIATES: NOT DETECTED
TETRAHYDROCANNABINOL: NOT DETECTED

## 2016-10-13 LAB — COMPREHENSIVE METABOLIC PANEL
ALBUMIN: 4.7 g/dL (ref 3.5–5.0)
ALK PHOS: 64 U/L (ref 38–126)
ALT: 13 U/L — AB (ref 14–54)
AST: 20 U/L (ref 15–41)
Anion gap: 11 (ref 5–15)
BILIRUBIN TOTAL: 0.3 mg/dL (ref 0.3–1.2)
BUN: 7 mg/dL (ref 6–20)
CALCIUM: 9 mg/dL (ref 8.9–10.3)
CO2: 24 mmol/L (ref 22–32)
CREATININE: 0.64 mg/dL (ref 0.44–1.00)
Chloride: 109 mmol/L (ref 101–111)
GFR calc Af Amer: >60 mL/min (ref >60)
GFR calc non Af Amer: >60 mL/min (ref >60)
Glucose, Bld: 108 mg/dL — ABNORMAL HIGH (ref 65–99)
Potassium: 3.7 mmol/L (ref 3.5–5.1)
SODIUM: 144 mmol/L (ref 135–145)
TOTAL PROTEIN: 7.3 g/dL (ref 6.5–8.1)

## 2016-10-13 LAB — CBC WITH DIFFERENTIAL/PLATELET
BASOS PCT: 0 %
Basophils Absolute: 0 10*3/uL (ref 0.0–0.1)
EOS ABS: 0 10*3/uL (ref 0.0–0.7)
Eosinophils Relative: 0 %
HCT: 35.9 % — ABNORMAL LOW (ref 36.0–46.0)
HEMOGLOBIN: 11.9 g/dL — AB (ref 12.0–15.0)
LYMPHS ABS: 1.2 10*3/uL (ref 0.7–4.0)
Lymphocytes Relative: 20 %
MCH: 28.5 pg (ref 26.0–34.0)
MCHC: 33.1 g/dL (ref 30.0–36.0)
MCV: 86.1 fL (ref 78.0–100.0)
MONO ABS: 0.1 10*3/uL (ref 0.1–1.0)
MONOS PCT: 2 %
Neutro Abs: 4.6 10*3/uL (ref 1.7–7.7)
Neutrophils Relative %: 78 %
Platelets: 235 10*3/uL (ref 150–400)
RBC: 4.17 MIL/uL (ref 3.87–5.11)
RDW: 12.5 % (ref 11.5–15.5)
WBC: 5.9 10*3/uL (ref 4.0–10.5)

## 2016-10-13 LAB — ETHANOL: Alcohol, Ethyl (B): 197 mg/dL — ABNORMAL HIGH (ref <5)

## 2016-10-13 LAB — I-STAT BETA HCG BLOOD, ED (MC, WL, AP ONLY): I-stat hCG, quantitative: <5 m[IU]/mL (ref <5)

## 2016-10-13 MED ORDER — FAMOTIDINE IN NACL 20-0.9 MG/50ML-% IV SOLN
20.0000 mg | Freq: Once | INTRAVENOUS | Status: AC
  Administered 2016-10-13: 20 mg via INTRAVENOUS
  Filled 2016-10-13: qty 50

## 2016-10-13 MED ORDER — SODIUM CHLORIDE 0.9 % IV BOLUS (SEPSIS)
1000.0000 mL | Freq: Once | INTRAVENOUS | Status: AC
  Administered 2016-10-13: 1000 mL via INTRAVENOUS

## 2016-10-13 MED ORDER — ONDANSETRON HCL 4 MG/2ML IJ SOLN
4.0000 mg | Freq: Once | INTRAMUSCULAR | Status: AC
Start: 2016-10-13 — End: 2016-10-13
  Administered 2016-10-13: 4 mg via INTRAVENOUS
  Filled 2016-10-13: qty 2

## 2016-10-13 MED ORDER — ESCITALOPRAM OXALATE 20 MG PO TABS: 20.0000 mg | ORAL_TABLET | Freq: Every day | ORAL | 2 refills | Status: AC

## 2016-10-13 NOTE — BH Assessment (Addendum)
Tele Assessment Note   Sarah Chen is an 26 y.o. female who presents to the ED voluntarily due to excessive drinking. Pt reports she has been drinking more than she used to for the past 6 months since she began her stressful job. Pt reports she was with a friend drinking vodka and her friend left and the pt continued drinking alone. Pt denies current SI but states about a month ago she texted a suicide hotline. Per chart, pt ended a relationship with her boyfriend which led to her increased depression. Pt was asked her current SI and the pt stated "um, not um lately, no." Pt denies HI and denies AVH. 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 Pt endorses changes in her sleeping habits including feeling restless and sleeping for only 2 hours at a time. Pt reports she has not had an appetite and she has lost about 8 lbs due to her decreased appetite.    Per Dr. Lucianne Muss, MD pt will need to be reassessed by Dr. Rene Kocher, MD and be provided with OPT resources to utilize in order to assist with her depression and ability to cope with stressful situations.  Diagnosis: Alcohol Use D/O; Unspecified Depressive D/O  Past Medical History:  Past Medical History:  Diagnosis Date  . Anxiety   . Anxiety   . Anxiety   . Endometriosis   . Knee pain   . Mental disorder    bipolar    Past Surgical History:  Procedure Laterality Date  . KNEE ARTHROSCOPY    . KNEE SURGERY    . LAPAROSCOPY N/A 06/28/2014   Procedure: LAPAROSCOPY DIAGNOSTIC, LYSIS OF ADHESIONS, CAUTERIZATION ENDOMETREOSIS ;  Surgeon: Sarah Phenix, MD;  Location: [facility];  Service: Gynecology;  Laterality: N/A;  vaginal    Family History:  Family History  Problem Relation Age of Onset  . Arthritis Mother   . Mental illness Mother   . Mental illness Father   . Asthma Sister   . Mental illness Brother   . Diabetes Paternal Grandmother   . Heart disease Paternal Grandmother   . Cancer Paternal Grandfather      Social History:  reports that she has never smoked. She has never used smokeless tobacco. She reports that she drinks alcohol. She reports that she does not use drugs.  Additional Social History:  Alcohol / Drug Use Pain Medications: See MAR Prescriptions: See MAR Over the Counter: See MAR History of alcohol / drug use?: Yes Substance #1 Name of Substance 1: Alcohol 1 - Age of First Use: 16 1 - Amount (size/oz): pt stated "well obviously a lot because I've never been to the hospital for this before."  1 - Frequency: pt reports "I've been drinking a lot more since my new job in January." 1 - Duration: ongoing 1 - Last Use / Amount: 10/13/16  CIWA: CIWA-Ar BP: (!) 102/52 Pulse Rate: 100 Nausea and Vomiting: 5 Tactile Disturbances: none Tremor: no tremor Auditory Disturbances: not present Paroxysmal Sweats: no sweat visible Visual Disturbances: not present Anxiety: no anxiety, at ease Headache, Fullness in Head: none present Agitation: normal activity Orientation and Clouding of Sensorium: oriented and can do serial additions CIWA-Ar Total: 5 COWS:    PATIENT STRENGTHS: (choose at least two) Average or above average intelligence Capable of independent living Communication skills Financial means General fund of knowledge Motivation for treatment/growth Physical Health Work skills  Allergies: No Known Allergies  Home Medications:  (Not in  a hospital admission)  OB/GYN Status:  No LMP recorded. Patient is not currently having periods (Reason: Oral contraceptives).  General Assessment Data Location of Assessment: [facility] Assessment: In system Is this a Tele or Face-to-Face Assessment?: Face-to-Face Is this an Initial Assessment or a Re-assessment for this encounter?: Initial Assessment Marital status: Single Is patient pregnant?: No Pregnancy Status: No Living Arrangements: Alone Can pt return to current living arrangement?: Yes Admission Status:  Voluntary Is patient capable of signing voluntary admission?: Yes Referral Source: Self/Family/Friend [facility]: [facility]      Crisis Care Plan Living Arrangements: Alone Name of Psychiatrist: none Name of Therapist: none  Education Status Is patient currently in school?: Yes Current Grade: college  Highest grade of school patient has completed: some college Name of school: [employer]  Risk to self with the past 6 months Suicidal Ideation: No-Not Currently/Within Last 6 Months Has patient been a risk to self within the past 6 months prior to admission? : No Suicidal Intent: No-Not Currently/Within Last 6 Months Has patient had any suicidal intent within the past 6 months prior to admission? : No Is patient at risk for suicide?: No Suicidal Plan?: No Has patient had any suicidal plan within the past 6 months prior to admission? : No Access to Means: No What has been your use of drugs/alcohol within the last 12 months?: reports to heavy alcohol use over the past 6 months  Previous Attempts/Gestures: No Triggers for Past Attempts: None known Intentional Self Injurious Behavior: None Family Suicide History: No Recent stressful life event(s): Loss (Comment), Other (Comment) (work stress, ended a relationship with her boyfriend) Persecutory voices/beliefs?: No Depression: Yes Depression Symptoms: Despondent, Insomnia, Tearfulness, Loss of interest in usual pleasures, Feeling worthless/self pity, Feeling angry/irritable Substance abuse history and/or treatment for substance abuse?: Yes Suicide prevention information given to non-admitted patients: Not applicable  Risk to Others within the past 6 months Homicidal Ideation: No Does patient have any lifetime risk of violence toward others beyond the six months prior to admission? : No Thoughts of Harm to Others: No Current Homicidal Intent: No Current Homicidal Plan: No Access to Homicidal Means: No History of harm to others?:  No Assessment of Violence: None Noted Does patient have access to weapons?: No Criminal Charges Pending?: No Does patient have a court date: No Is patient on probation?: No  Psychosis Hallucinations: None noted Delusions: None noted  Mental Status Report Appearance/Hygiene: Disheveled, In scrubs Eye Contact: Poor Motor Activity: Freedom of movement Speech: Logical/coherent, Slow Level of Consciousness: Quiet/awake Mood: Depressed, Despair Affect: Depressed, Sad Anxiety Level: Minimal Thought Processes: Coherent, Relevant Judgement: Impaired Orientation: Person, Place, Time, Situation, Appropriate for developmental age Obsessive Compulsive Thoughts/Behaviors: None  Cognitive Functioning Concentration: Normal Memory: Recent Intact, Remote Intact IQ: Average Insight: Fair Impulse Control: Fair Appetite: Poor Weight Loss: 8 Sleep: Decreased Total Hours of Sleep: 2 Vegetative Symptoms: None  ADLScreening [facility] Assessment Services) Patient's cognitive ability adequate to safely complete daily activities?: Yes Patient able to express need for assistance with ADLs?: Yes Independently performs ADLs?: Yes (appropriate for developmental age)  Prior Inpatient Therapy Prior Inpatient Therapy: No  Prior Outpatient Therapy Prior Outpatient Therapy: No Does patient have an ACCT team?: No Does patient have Intensive In-House Services?  : No Does patient have [facility] services? : No Does patient have P4CC services?: No  ADL Screening (condition at time of admission) Patient's cognitive ability adequate to safely complete daily activities?: Yes Is the patient deaf or have difficulty hearing?:  No Does the patient have difficulty seeing, even when wearing glasses/contacts?: No Does the patient have difficulty concentrating, remembering, or making decisions?: No Patient able to express need for assistance with ADLs?: Yes Does the patient have difficulty dressing or bathing?:  No Independently performs ADLs?: Yes (appropriate for developmental age) Does the patient have difficulty walking or climbing stairs?: No Weakness of Legs: None Weakness of Arms/Hands: None  Home Assistive Devices/Equipment Home Assistive Devices/Equipment: None    Abuse/Neglect Assessment (Assessment to be complete while patient is alone) Physical Abuse: Denies Verbal Abuse: Denies Sexual Abuse: Denies Exploitation of patient/patient's resources: Denies Self-Neglect: Denies     Merchant navy officer (For Healthcare) Does Patient Have a Medical Advance Directive?: No Would patient like information on creating a medical advance directive?: No - Patient declined    Additional Information 1:1 In Past 12 Months?: No CIRT Risk: No Elopement Risk: No Does patient have medical clearance?: Yes     Disposition:  Disposition Initial Assessment Completed for this Encounter: Yes Disposition of Patient: Other dispositions Other disposition(s): Other (Comment) (AM psych eval per Dr. Lucianne Muss, MD and OPT resources)  Karolee Ohs 10/13/2016 6:33 AM

## 2016-10-13 NOTE — ED Provider Notes (Signed)
[facility]-EMER[facility]ENCY DEPT Provider Note   CSN: [id number] Arrival date & time: 10/13/16  0226  Time seen 03:17 AM   History   Chief Complaint Chief Complaint  Patient presents with  . Alcohol Intoxication    HPI Sarah Chen is a [age] y.o. female.  HPI  Patient reports "I drink too much" tonight. She cannot tell me why she drank so much however later in the course of our discussion she states she broke up with her boyfriend about a month ago. She states she did not want to break up with him. However she states she felt like they were celebrating with her friends tonight. She states she has never drank this much before. She states after she went home she felt bad and had a lot of nausea and vomited twice. She states she still has nausea. She states her chest is burning. She states she feels like she is depressed but she slowly denies being suicidal or having homicidal ideation. She does states she would like to speak to mental health provider.  PCP none  Past Medical History:  Diagnosis Date  . Anxiety   . Anxiety   . Anxiety   . Endometriosis   . Knee pain   . Mental disorder    bipolar    Patient Active Problem List   Diagnosis Date Noted  . Weight gain 02/19/2016  . Muscle spasm 10/31/2015  . Anxiety disorder 02/14/2015  . Depressive disorder 02/14/2015  . Primary insomnia 01/23/2015  . Endometriosis 06/28/2014  . Pelvic pain in female 03/17/2014  . Initiation of Depo Provera 03/17/2014  . Chronic pelvic pain in female 03/17/2014    Past Surgical History:  Procedure Laterality Date  . KNEE ARTHROSCOPY    . KNEE SURGERY    . LAPAROSCOPY N/A 06/28/2014   Procedure: LAPAROSCOPY DIAGNOSTIC, LYSIS OF ADHESIONS, CAUTERIZATION ENDOMETREOSIS ;  Surgeon: Adam PhenixJames G Arnold, MD;  Location: [facility];  Service: Gynecology;  Laterality: N/A;  vaginal    OB History    Gravida Para Term Preterm AB Living   0         0   SAB TAB Ectopic Multiple Live Births                   Home  Medications    None except for mirena  Prior to Admission medications   Medication Sig Start Date End Date Taking? Authorizing Provider  levonorgestrel (MIRENA) 20 MCG/24HR IUD 1 each by Intrauterine route once.   Yes [provider]  Multiple Vitamin (MULTIVITAMIN WITH MINERALS) TABS tablet Take 1 tablet by mouth daily.   Yes [provider]  gabapentin (NEURONTIN) 300 MG capsule TAKE 1 CAPSULE BY MOUTH 3 (THREE) TIMES DAILY. Patient not taking: Reported on 10/13/2016 06/03/16   Adam PhenixArnold, James G, MD  ibuprofen (ADVIL,MOTRIN) 800 MG tablet Take 1 tablet (800 mg total) by mouth every 8 (eight) hours as needed. Patient not taking: Reported on 10/13/2016 04/12/16   Aviva SignsWilliams, Marie L, CNM  SRONYX 0.1-20 MG-MCG tablet TAKE 1 TABLET BY MOUTH DAILY. Patient not taking: Reported on 10/13/2016 07/18/16   Adam PhenixArnold, James G, MD    Family History Family History  Problem Relation Age of Onset  . Arthritis Mother   . Mental illness Mother   . Mental illness Father   . Asthma Sister   . Mental illness Brother   . Diabetes Paternal Grandmother   . Heart disease Paternal Grandmother   . Cancer Paternal Grandfather  Social History Social History  Substance Use Topics  . Smoking status: Never Smoker  . Smokeless tobacco: Never Used  . 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   Review of Systems Review of Systems  All other systems reviewed and are negative.    Physical Exam Updated Vital Signs BP (!) 101/50   Pulse 91   Temp 97.3 F (36.3 C) (Oral)   Resp 14   Ht 5\' 6"  (1.676 m)   Wt 66.7 kg (147 lb)   SpO2 100%   BMI 23.73 kg/m   Vital signs normal    Physical Exam  Constitutional: She is oriented to person, place, and time. She appears well-developed and well-nourished.  Non-toxic appearance. She does not appear ill. No distress.    HENT:  Head: Normocephalic and atraumatic.  Right Ear: External ear normal.  Left Ear: External ear normal.  Nose: Nose normal. No mucosal edema or rhinorrhea.  Mouth/Throat: Oropharynx is clear and moist and mucous membranes are normal. No dental abscesses or uvula swelling.  Eyes: Conjunctivae and EOM are normal. Pupils are equal, round, and reactive to light.  Neck: Normal range of motion and full passive range of motion without pain. Neck supple.  Cardiovascular: Normal rate, regular rhythm and normal heart sounds.  Exam reveals no gallop and no friction rub.   No murmur heard. Pulmonary/Chest: Effort normal and breath sounds normal. No respiratory distress. She has no wheezes. She has no rhonchi. She has no rales. She exhibits no tenderness and no crepitus.  Abdominal: Soft. Normal appearance and bowel sounds are normal. She exhibits no distension. There is no tenderness. There is no rebound and no guarding.  Musculoskeletal: Normal range of motion. She exhibits no edema or tenderness.  Moves all extremities well.   Neurological: She is alert and oriented to person, place, and time. She has normal strength. No cranial nerve deficit.  Skin: Skin is warm, dry and intact. No rash noted. No erythema. No pallor.  Psychiatric: Her speech is delayed. She is slowed.  Nursing note and vitals reviewed.    ED Treatments / Results  Labs (all labs ordered are listed, but only abnormal results are displayed) Results for orders placed or performed during the hospital encounter of 10/13/16  Comprehensive metabolic panel  Result Value Ref Range   Sodium 144 135 - 145 mmol/L   Potassium 3.7 3.5 - 5.1 mmol/L   Chloride 109 101 - 111 mmol/L   CO2 24 22 - 32 mmol/L   Glucose, Bld 108 (H) 65 - 99 mg/dL   BUN 7 6 - 20 mg/dL   Creatinine, Ser 1.61 0.44 - 1.00 mg/dL   Calcium 9.0 8.9 - 09.6 mg/dL   Total Protein 7.3 6.5 - 8.1 g/dL   Albumin 4.7 3.5 - 5.0 g/dL   AST 20 15 - 41 U/L   ALT 13 (L) 14  - 54 U/L   Alkaline Phosphatase 64 38 - 126 U/L   Total Bilirubin 0.3 0.3 - 1.2 mg/dL   GFR calc non Af Amer >60 >60 mL/min   GFR calc Af Amer >60 >60 mL/min   Anion gap 11 5 - 15  Ethanol  Result Value Ref Range   Alcohol, Ethyl (B) 197 (H) <5 mg/dL  CBC with Differential  Result Value Ref Range   WBC 5.9 4.0 -  10.5 K/uL   RBC 4.17 3.87 - 5.11 MIL/uL   Hemoglobin 11.9 (L) 12.0 - 15.0 g/dL   HCT 81.1 (L) 91.4 - 78.2 %   MCV 86.1 78.0 - 100.0 fL   MCH 28.5 26.0 - 34.0 pg   MCHC 33.1 30.0 - 36.0 g/dL   RDW 95.6 21.3 - 08.6 %   Platelets 235 150 - 400 K/uL   Neutrophils Relative % 78 %   Neutro Abs 4.6 1.7 - 7.7 K/uL   Lymphocytes Relative 20 %   Lymphs Abs 1.2 0.7 - 4.0 K/uL   Monocytes Relative 2 %   Monocytes Absolute 0.1 0.1 - 1.0 K/uL   Eosinophils Relative 0 %   Eosinophils Absolute 0.0 0.0 - 0.7 K/uL   Basophils Relative 0 %   Basophils Absolute 0.0 0.0 - 0.1 K/uL  Urine rapid drug screen (hosp performed)  Result Value Ref Range   Opiates NONE DETECTED NONE DETECTED   Cocaine NONE DETECTED NONE DETECTED   Benzodiazepines NONE DETECTED NONE DETECTED   Amphetamines NONE DETECTED NONE DETECTED   Tetrahydrocannabinol NONE DETECTED NONE DETECTED   Barbiturates NONE DETECTED NONE DETECTED  I-Stat beta hCG blood, ED  Result Value Ref Range   I-stat hCG, quantitative <5.0 <5 mIU/mL   Comment 3           Laboratory interpretation all normal except alcohol intoxication    EKG  EKG Interpretation None       Radiology No results found.  Procedures Procedures (including critical care time)  Medications Ordered in ED Medications  sodium chloride 0.9 % bolus 1,000 mL (0 mLs Intravenous Stopped 10/13/16 0540)  sodium chloride 0.9 % bolus 1,000 mL (0 mLs Intravenous Stopped 10/13/16 0521)  ondansetron (ZOFRAN) injection 4 mg (4 mg Intravenous Given 10/13/16 0402)  famotidine (PEPCID) IVPB 20 mg premix (0 mg Intravenous Stopped 10/13/16 0432)     Initial Impression  / Assessment and Plan / ED Course  I have reviewed the triage vital signs and the nursing notes.  Pertinent labs & imaging results that were available during my care of the patient were reviewed by me and considered in my medical decision making (see chart for details).  Patient was given IV fluids, IV Zofran and IV Pepcid for her burning in her chest.  Recheck at 5:30 AM patient is feeling better. She is awake and alert. She feels like she can do her TTS consult now.  6:30 AM TTS is evaluate patient, they want a psychiatrist to see her in the a.m. and anticipates she'll be discharged with outpatient referral.  Final Clinical Impressions(s) / ED Diagnoses   Final diagnoses:  Acute alcoholic intoxication without complication (HCC)  Depression, acute   Disposition pending  Devoria Albe, MD, Concha Pyo, MD 10/13/16 0700

## 2016-10-13 NOTE — ED Notes (Signed)
TTS at bedside to evaluate pt ?

## 2016-10-13 NOTE — ED Triage Notes (Signed)
Pt presented by EMS for alcohol intoxication. EMS advised that pt had 2 episodes of vomiting during transport. Pt currently alert and oriented x4.

## 2016-10-13 NOTE — ED Notes (Signed)
Bed: [id number] Expected date:  Expected time:  Means of arrival:  Comments: 20's ETOH

## 2016-10-13 NOTE — ED Notes (Signed)
Pt ambulated to restroom without any difficulty.

## 2016-10-13 NOTE — Consult Note (Signed)
[facility] Psych ED Discharge  10/13/2016 9:52 AM Sarah Chen  MRN:  [id number] Principal Problem: <principal problem not specified> Discharge Diagnoses:  Patient Active Problem List   Diagnosis Date Noted  . Weight gain [R63.5] 02/19/2016  . Muscle spasm [M62.838] 10/31/2015  . Anxiety disorder [F41.9] 02/14/2015  . Depressive disorder [F32.9] 02/14/2015  . Primary insomnia [F51.01] 01/23/2015  . Endometriosis [N80.9] 06/28/2014  . Pelvic pain in female [R10.2] 03/17/2014  . Initiation of Depo Provera [Z30.013] 03/17/2014  . Chronic pelvic pain in female [R10.2, G89.29] 03/17/2014    Subjective: Sarah Chen did a nice job discussing her stressors with Clinical research associate.  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  She reports that she has been off lexapro because her provider left [facility] in [place] a few months ago and she ran out last month.  She reports she was taking 20 mg daily, we agreed to restart at 10 mg daily x 1 week, then increase to 20 mg daily.  She is agreable to outpatient BH follow-up with [place] office.   She denies any SI, or any daily alcohol use. No AVH or mania. She was also given latuda 60 mg daily in the past. Discussed that we should first start with SSRI and consider additional agents with outpatient provider.  She denise any acute medical issues or concerns at this time. She reports that she is fairly new to [place], but she has friends she is making.  Discussed some additional group therapy resources with the [facility]. She is close with dad and brother, but they live in [place].  Total Time spent with patient: 30 minutes  Past Psychiatric History: no psych hospitalizations, outpatient care with NP Mozingo  Past Medical History:  Past Medical History:  Diagnosis Date  . Anxiety   . Anxiety   . Anxiety   . Endometriosis   . Knee pain   . Mental disorder    bipolar    Past Surgical History:   Procedure Laterality Date  . KNEE ARTHROSCOPY    . KNEE SURGERY    . LAPAROSCOPY N/A 06/28/2014   Procedure: LAPAROSCOPY DIAGNOSTIC, LYSIS OF ADHESIONS, CAUTERIZATION ENDOMETREOSIS ;  Surgeon: Adam Phenix, MD;  Location: [facility];  Service: Gynecology;  Laterality: N/A;  vaginal   Family History:  Family History  Problem Relation Age of Onset  . Arthritis Mother   . Mental illness Mother   . Mental illness Father   . Asthma Sister   . Mental illness Brother   . Diabetes Paternal Grandmother   . Heart disease Paternal Grandmother   . Cancer Paternal Grandfather    Family Psychiatric  History: See intake H&P for full details. Reviewed, with no updates at this time.  Social History:  History  Alcohol Use  . Yes    Comment: occassionally     History  Drug Use No    Social History   Social History  . Marital status: Single    Spouse name: N/A  . Number of children: N/A  . Years of education: N/A   Social History Main Topics  . Smoking status: Never Smoker  . Smokeless tobacco: Never Used  . Alcohol use Yes     Comment: occassionally  . Drug use: No  . Sexual activity: Yes    Birth control/ protection: Pill     Comment: BCP   Other Topics Concern  . None  Social History Narrative  . None    Has this patient used any form of tobacco in the last 30 days? (Cigarettes, Smokeless Tobacco, Cigars, and/or Pipes) Prescription not provided because: non-smoker  Current Medications: No current facility-administered medications for this encounter.    Current Outpatient Prescriptions  Medication Sig Dispense Refill  . levonorgestrel (MIRENA) 20 MCG/24HR IUD 1 each by Intrauterine route once.    . Multiple Vitamin (MULTIVITAMIN WITH MINERALS) TABS tablet Take 1 tablet by mouth daily.    Marland Kitchen escitalopram (LEXAPRO) 20 MG tablet Take 1 tablet (20 mg total) by mouth daily. Take 10 mg daily for 1 week, then increase to 20 mg daily 30 tablet 2  . gabapentin (NEURONTIN) 300 MG  capsule TAKE 1 CAPSULE BY MOUTH 3 (THREE) TIMES DAILY. (Patient not taking: Reported on 10/13/2016) 90 capsule 3  . ibuprofen (ADVIL,MOTRIN) 800 MG tablet Take 1 tablet (800 mg total) by mouth every 8 (eight) hours as needed. (Patient not taking: Reported on 10/13/2016) 60 tablet 1  . SRONYX 0.1-20 MG-MCG tablet TAKE 1 TABLET BY MOUTH DAILY. (Patient not taking: Reported on 10/13/2016) 28 tablet 3   PTA Medications:  (Not in a hospital admission)  Musculoskeletal: Strength & Muscle Tone: within normal limits Gait & Station: normal Patient leans: N/A  Psychiatric Specialty Exam: Physical Exam  Review of Systems  Constitutional: Negative.   HENT: Negative.   Respiratory: Negative.   Cardiovascular: Negative.   Gastrointestinal: Negative.   Neurological: Negative.   Psychiatric/Behavioral: Positive for depression. The patient is nervous/anxious and has insomnia.     Blood pressure (!) 102/52, pulse 100, temperature 97.3 F (36.3 C), temperature source Oral, resp. rate 16, height 5\' 6"  (1.676 m), weight 66.7 kg (147 lb), SpO2 100 %.Body mass index is 23.73 kg/m.  General Appearance: Casual and Well Groomed  Eye Contact:  Good  Speech:  Clear and Coherent  Volume:  Normal  Mood:  Dysphoric  Affect:  Congruent  Thought Process:  Goal Directed  Orientation:  Full (Time, Place, and Person)  Thought Content:  Logical  Suicidal Thoughts:  No  Homicidal Thoughts:  No  Memory:  Immediate;   Fair  Judgement:  Intact  Insight:  Good  Psychomotor Activity:  Normal  Concentration:  Concentration: Good  Recall:  Good  Fund of Knowledge:  Good  Language:  Good  Akathisia:  Negative  Handed:  Right  AIMS (if indicated):     Assets:  Communication Skills Desire for Improvement Financial Resources/Insurance Housing Social Support Transportation Vocational/Educational  ADL's:  Intact  Cognition:  WNL  Sleep:       Demographic Factors:  Adolescent or young adult, Caucasian and  Living alone  Loss Factors: adjusting to new job  Historical Factors: Family history of mental illness or substance abuse  Risk Reduction Factors:   Sense of responsibility to family, Employed, Positive social support and Positive coping skills or problem solving skills  Continued Clinical Symptoms:  Depression:   Insomnia  Cognitive Features That Contribute To Risk:  None    Suicide Risk:  Minimal: No identifiable suicidal ideation.  Patients presenting with no risk factors but with morbid ruminations; may be classified as minimal risk based on the severity of the depressive symptoms   Plan Of Care/Follow-up recommendations:  Activity:  resume normal Diet:  resume normal  Disposition:  Discharge home with outpatient care arrangements with [facility] outpatient No psych admission indicated Restart lexapro 10 mg x 1 week, then increase to 20  mg maintenance dose    Burnard LeighAlexander Arya Eksir, MD 10/13/2016, 9:52 AM

## 2016-10-13 NOTE — Progress Notes (Signed)
CSW spoke with patient at bedside regarding psychiatrist's recommendation to [facility]. Patient reported that she has been to an outpatient provider in the past. CSW encouraged patient to call and make an appointment, patient reported that she would. CSW provided patient with information about [facility]. CSW inquired if patient had any questions, patient asked when she would be able to leave. CSW agreed to ask patient's RN.   Celso SickleKimberly Lenis Nettleton, LCSWA [facility]  Clinical Social Worker [phone]

## 2016-10-13 NOTE — Discharge Instructions (Signed)
For your ongoing mental health needs, you are advised to follow up with [facility]. Please call to schedule an appointment.   [facility] [address] [place], [place] [address] [phone]

## 2017-07-19 IMAGING — US US TRANSVAGINAL NON-OB
1 series · 15 of 25 positions shown · non-contrast
Comparison: Pelvic ultrasound 01/04/2014

CLINICAL DATA: Pelvic/abdominal pain in the right lower quadrant
for 1-1/2 weeks.

EXAM:
TRANSABDOMINAL AND TRANSVAGINAL ULTRASOUND OF PELVIS
TECHNIQUE: Both transabdominal and transvaginal ultrasound examinations of the
pelvis were performed. Transabdominal technique was performed for
global imaging of the pelvis including uterus, ovaries, adnexal
regions, and pelvic cul-de-sac. It was necessary to proceed with
endovaginal exam following the transabdominal exam to visualize the
ovaries and adnexa.

[Series 1: us transvaginal non-ob · 15 of 43 slices shown]
[im 1/43]
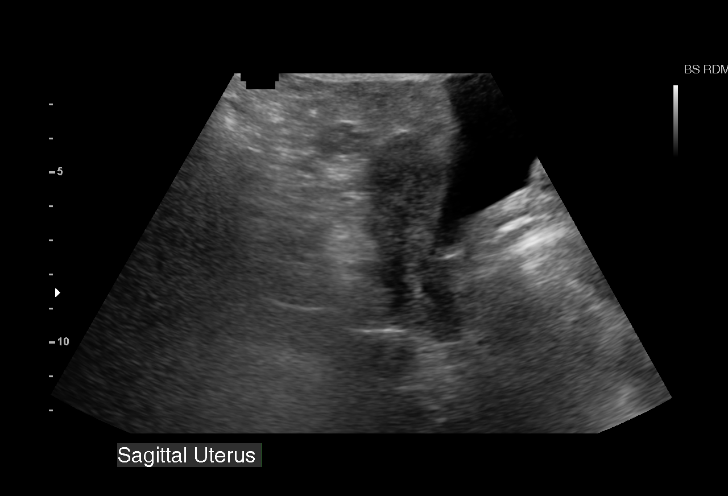
[im 4/43]
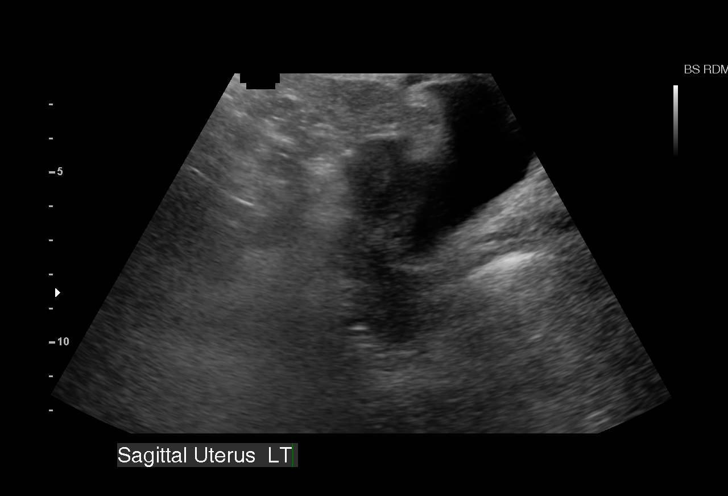
[im 8/43]
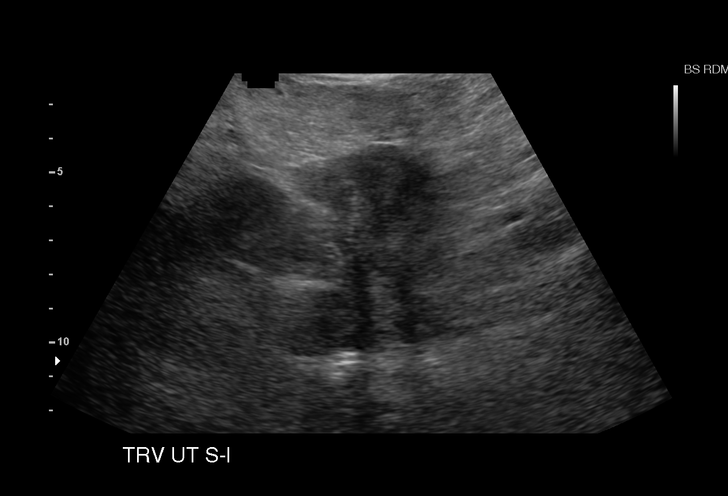
[im 9/43]
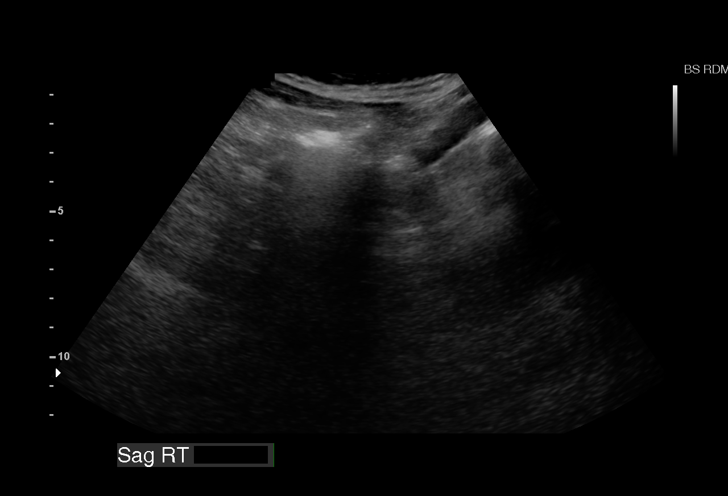
[im 13/43]
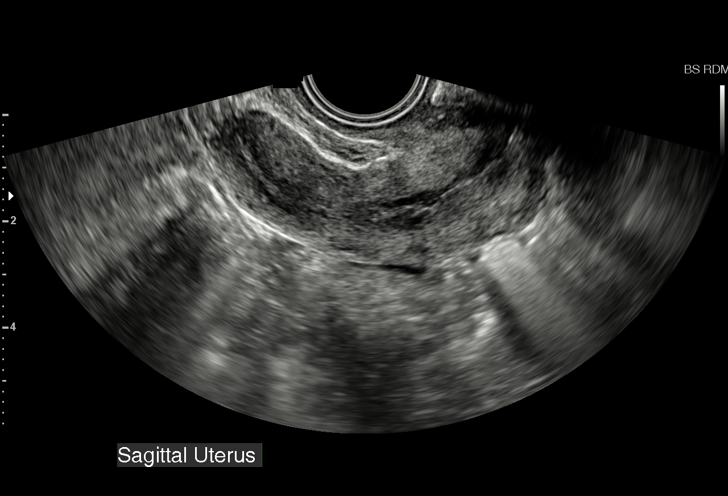
[im 16/43]
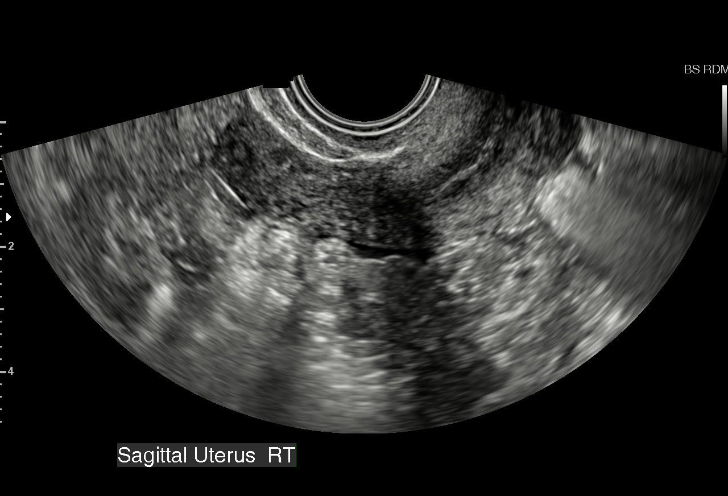
[im 18/43]
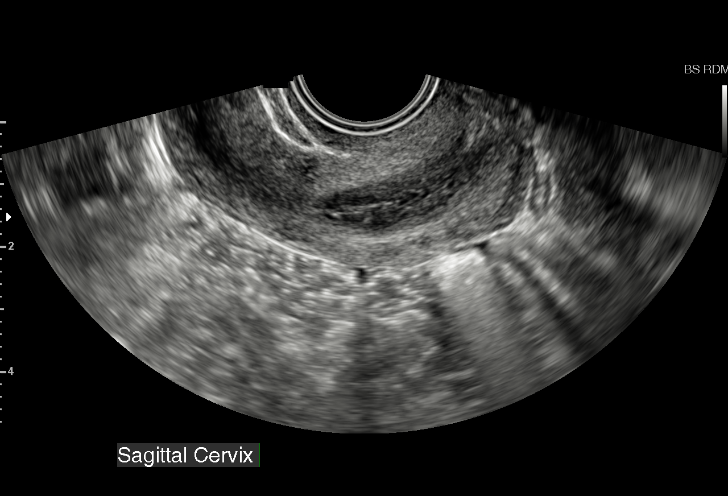
[im 22/43]
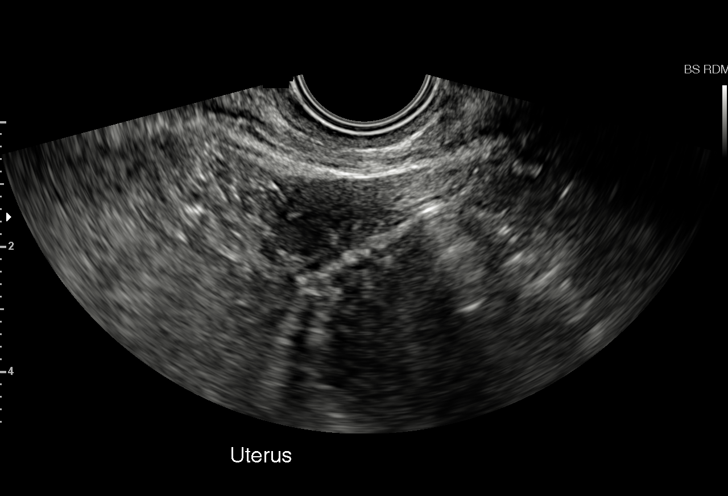
[im 25/43]
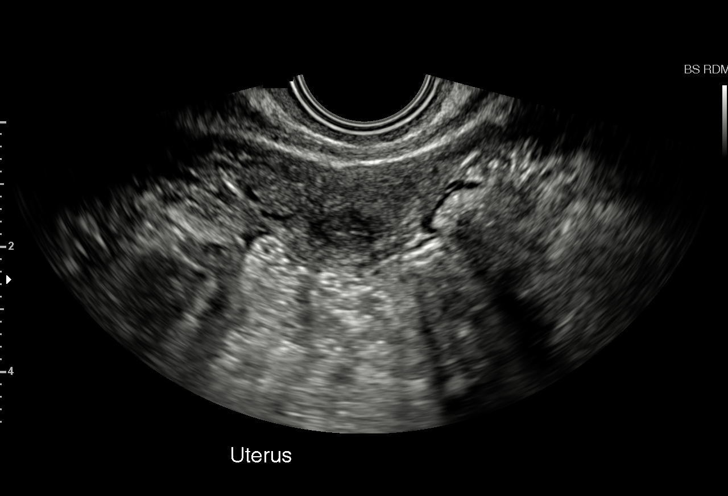
[im 27/43]
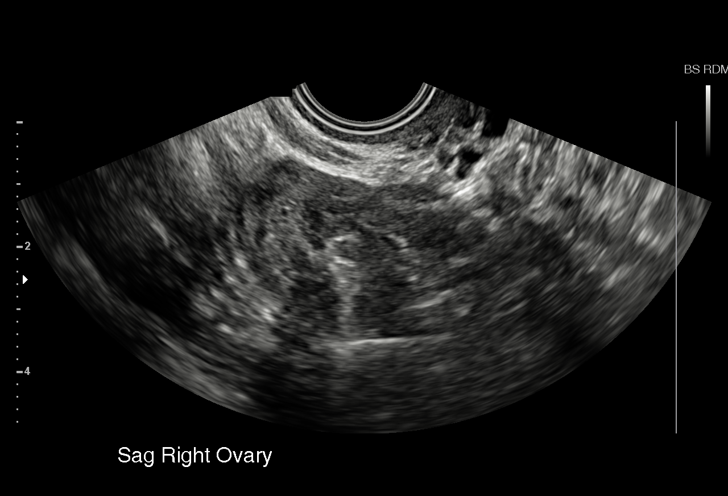
[im 30/43]
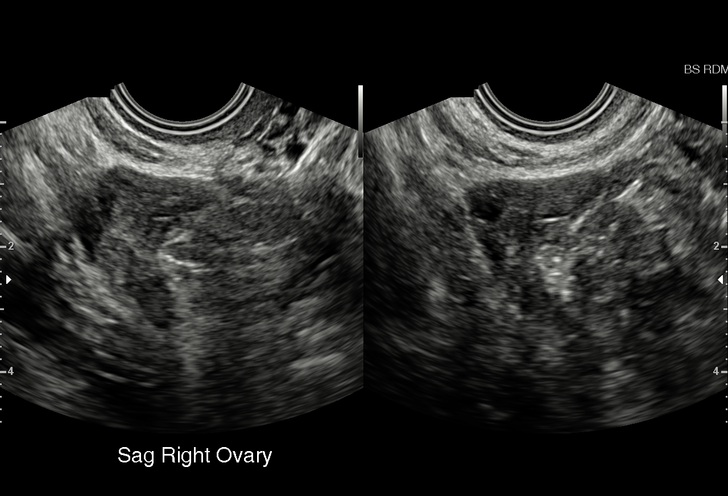
[im 34/43]
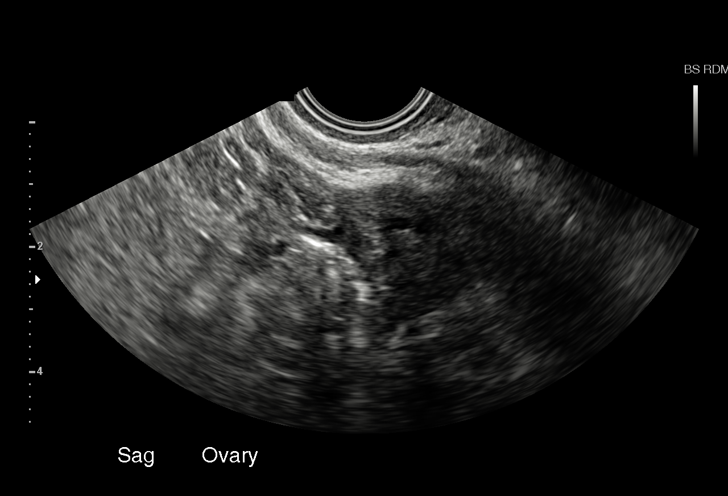
[im 36/43]
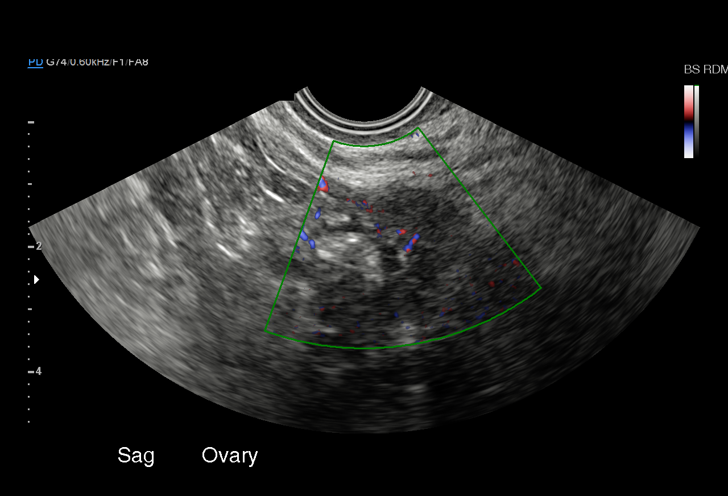
[im 39/43]
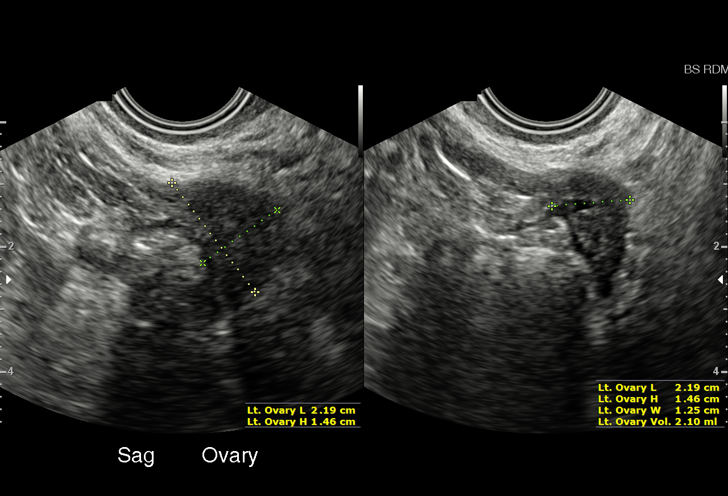
[im 43/43]
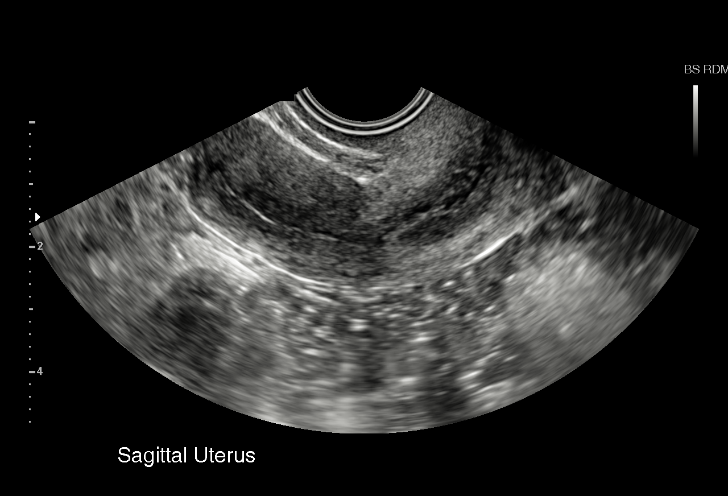

[15 of 25 positions shown; findings below may reference images not displayed]

FINDINGS: Uterus

Measurements: 6.3 x 1.8 x 2.9 cm. No fibroids or other mass
visualized.

Endometrium

Thickness: 6 mm.  No focal abnormality visualized.

Right ovary

Measurements: 2.7 x 2.1 x 1.8 cm. Normal appearance/no adnexal mass.
Blood flow is noted.

Left ovary

Measurements: 2.2 x 1.5 x 1.3 cm. Normal appearance/no adnexal mass.
Blood flow is noted.

Other findings

No abnormal free fluid.
IMPRESSION: Normal pelvic ultrasound.

## 2023-06-10 DIAGNOSIS — Z124 Encounter for screening for malignant neoplasm of cervix: Secondary | ICD-10-CM | POA: Diagnosis not present

## 2023-06-10 DIAGNOSIS — Z01411 Encounter for gynecological examination (general) (routine) with abnormal findings: Secondary | ICD-10-CM | POA: Diagnosis not present

## 2023-06-10 DIAGNOSIS — Z1389 Encounter for screening for other disorder: Secondary | ICD-10-CM | POA: Diagnosis not present

## 2023-06-10 DIAGNOSIS — Z1151 Encounter for screening for human papillomavirus (HPV): Secondary | ICD-10-CM | POA: Diagnosis not present

## 2023-06-10 DIAGNOSIS — Z01419 Encounter for gynecological examination (general) (routine) without abnormal findings: Secondary | ICD-10-CM | POA: Diagnosis not present

## 2023-06-10 DIAGNOSIS — Z113 Encounter for screening for infections with a predominantly sexual mode of transmission: Secondary | ICD-10-CM | POA: Diagnosis not present

## 2023-06-10 DIAGNOSIS — N809 Endometriosis, unspecified: Secondary | ICD-10-CM | POA: Diagnosis not present

## 2023-06-10 DIAGNOSIS — Z3040 Encounter for surveillance of contraceptives, unspecified: Secondary | ICD-10-CM | POA: Diagnosis not present

## 2023-06-10 DIAGNOSIS — Z202 Contact with and (suspected) exposure to infections with a predominantly sexual mode of transmission: Secondary | ICD-10-CM | POA: Diagnosis not present

## 2023-06-16 DIAGNOSIS — F3181 Bipolar II disorder: Secondary | ICD-10-CM | POA: Diagnosis not present

## 2023-06-24 DIAGNOSIS — N939 Abnormal uterine and vaginal bleeding, unspecified: Secondary | ICD-10-CM | POA: Diagnosis not present

## 2023-06-24 DIAGNOSIS — Z30430 Encounter for insertion of intrauterine contraceptive device: Secondary | ICD-10-CM | POA: Diagnosis not present

## 2023-06-28 DIAGNOSIS — F3181 Bipolar II disorder: Secondary | ICD-10-CM | POA: Diagnosis not present

## 2023-07-05 DIAGNOSIS — F3181 Bipolar II disorder: Secondary | ICD-10-CM | POA: Diagnosis not present

## 2023-07-12 DIAGNOSIS — F3181 Bipolar II disorder: Secondary | ICD-10-CM | POA: Diagnosis not present

## 2023-07-19 DIAGNOSIS — F3181 Bipolar II disorder: Secondary | ICD-10-CM | POA: Diagnosis not present

## 2023-07-26 DIAGNOSIS — F3181 Bipolar II disorder: Secondary | ICD-10-CM | POA: Diagnosis not present

## 2023-07-29 DIAGNOSIS — M25511 Pain in right shoulder: Secondary | ICD-10-CM | POA: Diagnosis not present

## 2023-08-02 DIAGNOSIS — F3181 Bipolar II disorder: Secondary | ICD-10-CM | POA: Diagnosis not present

## 2023-08-05 DIAGNOSIS — M25511 Pain in right shoulder: Secondary | ICD-10-CM | POA: Diagnosis not present

## 2023-08-09 DIAGNOSIS — F3181 Bipolar II disorder: Secondary | ICD-10-CM | POA: Diagnosis not present

## 2023-08-16 DIAGNOSIS — F3181 Bipolar II disorder: Secondary | ICD-10-CM | POA: Diagnosis not present

## 2023-08-23 DIAGNOSIS — F3181 Bipolar II disorder: Secondary | ICD-10-CM | POA: Diagnosis not present

## 2023-08-26 DIAGNOSIS — F3181 Bipolar II disorder: Secondary | ICD-10-CM | POA: Diagnosis not present

## 2023-08-26 DIAGNOSIS — F603 Borderline personality disorder: Secondary | ICD-10-CM | POA: Diagnosis not present

## 2023-08-26 DIAGNOSIS — F411 Generalized anxiety disorder: Secondary | ICD-10-CM | POA: Diagnosis not present

## 2023-08-26 DIAGNOSIS — F4312 Post-traumatic stress disorder, chronic: Secondary | ICD-10-CM | POA: Diagnosis not present

## 2023-08-27 DIAGNOSIS — M25511 Pain in right shoulder: Secondary | ICD-10-CM | POA: Diagnosis not present

## 2023-08-27 DIAGNOSIS — R29898 Other symptoms and signs involving the musculoskeletal system: Secondary | ICD-10-CM | POA: Diagnosis not present

## 2023-08-30 DIAGNOSIS — F3181 Bipolar II disorder: Secondary | ICD-10-CM | POA: Diagnosis not present

## 2023-09-02 DIAGNOSIS — R29898 Other symptoms and signs involving the musculoskeletal system: Secondary | ICD-10-CM | POA: Diagnosis not present

## 2023-09-02 DIAGNOSIS — M25511 Pain in right shoulder: Secondary | ICD-10-CM | POA: Diagnosis not present

## 2023-09-06 DIAGNOSIS — F3181 Bipolar II disorder: Secondary | ICD-10-CM | POA: Diagnosis not present

## 2023-09-12 DIAGNOSIS — F603 Borderline personality disorder: Secondary | ICD-10-CM | POA: Diagnosis not present

## 2023-09-12 DIAGNOSIS — F4312 Post-traumatic stress disorder, chronic: Secondary | ICD-10-CM | POA: Diagnosis not present

## 2023-09-12 DIAGNOSIS — F3181 Bipolar II disorder: Secondary | ICD-10-CM | POA: Diagnosis not present

## 2023-09-12 DIAGNOSIS — F411 Generalized anxiety disorder: Secondary | ICD-10-CM | POA: Diagnosis not present

## 2023-09-20 DIAGNOSIS — F3181 Bipolar II disorder: Secondary | ICD-10-CM | POA: Diagnosis not present

## 2023-09-27 DIAGNOSIS — F3181 Bipolar II disorder: Secondary | ICD-10-CM | POA: Diagnosis not present

## 2023-10-04 DIAGNOSIS — F3181 Bipolar II disorder: Secondary | ICD-10-CM | POA: Diagnosis not present

## 2023-10-11 DIAGNOSIS — F3181 Bipolar II disorder: Secondary | ICD-10-CM | POA: Diagnosis not present

## 2023-10-18 DIAGNOSIS — F3181 Bipolar II disorder: Secondary | ICD-10-CM | POA: Diagnosis not present

## 2023-10-27 DIAGNOSIS — F3181 Bipolar II disorder: Secondary | ICD-10-CM | POA: Diagnosis not present

## 2023-11-01 DIAGNOSIS — F3181 Bipolar II disorder: Secondary | ICD-10-CM | POA: Diagnosis not present
